# Patient Record
Sex: Female | Born: 1985 | Race: Black or African American | Hispanic: No | Smoking: Never smoker
Health system: Southern US, Community
[De-identification: ages and names within clinical notes are randomized; demographics above are authoritative.]

## PROBLEM LIST (undated history)

## (undated) ENCOUNTER — Inpatient Hospital Stay (HOSPITAL_COMMUNITY): Payer: Self-pay

## (undated) DIAGNOSIS — E049 Nontoxic goiter, unspecified: Secondary | ICD-10-CM

## (undated) DIAGNOSIS — IMO0002 Reserved for concepts with insufficient information to code with codable children: Secondary | ICD-10-CM

## (undated) DIAGNOSIS — O1490 Unspecified pre-eclampsia, unspecified trimester: Secondary | ICD-10-CM

## (undated) DIAGNOSIS — I10 Essential (primary) hypertension: Secondary | ICD-10-CM

## (undated) DIAGNOSIS — D649 Anemia, unspecified: Secondary | ICD-10-CM

## (undated) DIAGNOSIS — Z8632 Personal history of gestational diabetes: Secondary | ICD-10-CM

## (undated) DIAGNOSIS — B009 Herpesviral infection, unspecified: Secondary | ICD-10-CM

## (undated) DIAGNOSIS — O09299 Supervision of pregnancy with other poor reproductive or obstetric history, unspecified trimester: Secondary | ICD-10-CM

## (undated) DIAGNOSIS — L309 Dermatitis, unspecified: Secondary | ICD-10-CM

## (undated) DIAGNOSIS — A6000 Herpesviral infection of urogenital system, unspecified: Secondary | ICD-10-CM

## (undated) DIAGNOSIS — E669 Obesity, unspecified: Secondary | ICD-10-CM

## (undated) DIAGNOSIS — B999 Unspecified infectious disease: Secondary | ICD-10-CM

## (undated) HISTORY — PX: WISDOM TOOTH EXTRACTION: SHX21

## (undated) HISTORY — PX: TONSILLECTOMY: SUR1361

---
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
## 2005-03-20 ENCOUNTER — Ambulatory Visit (HOSPITAL_COMMUNITY): Admission: RE | Admit: 2005-03-20 | Discharge: 2005-03-20 | Payer: Self-pay | Admitting: Obstetrics & Gynecology

## 2006-02-04 ENCOUNTER — Emergency Department (HOSPITAL_COMMUNITY): Admission: EM | Admit: 2006-02-04 | Discharge: 2006-02-05 | Payer: Self-pay | Admitting: Emergency Medicine

## 2006-03-06 ENCOUNTER — Inpatient Hospital Stay (HOSPITAL_COMMUNITY): Admission: AD | Admit: 2006-03-06 | Discharge: 2006-03-06 | Payer: Self-pay | Admitting: Gynecology

## 2006-05-04 ENCOUNTER — Ambulatory Visit (HOSPITAL_COMMUNITY): Admission: RE | Admit: 2006-05-04 | Discharge: 2006-05-04 | Payer: Self-pay | Admitting: Obstetrics and Gynecology

## 2006-05-15 ENCOUNTER — Encounter: Payer: Self-pay | Admitting: Obstetrics and Gynecology

## 2006-05-15 ENCOUNTER — Inpatient Hospital Stay (HOSPITAL_COMMUNITY): Admission: AD | Admit: 2006-05-15 | Discharge: 2006-05-18 | Payer: Self-pay | Admitting: Obstetrics and Gynecology

## 2006-05-17 ENCOUNTER — Encounter (INDEPENDENT_AMBULATORY_CARE_PROVIDER_SITE_OTHER): Payer: Self-pay | Admitting: Specialist

## 2007-04-09 ENCOUNTER — Encounter: Admission: RE | Admit: 2007-04-09 | Discharge: 2007-04-09 | Payer: Self-pay | Admitting: Obstetrics and Gynecology

## 2007-04-18 ENCOUNTER — Inpatient Hospital Stay (HOSPITAL_COMMUNITY): Admission: AD | Admit: 2007-04-18 | Discharge: 2007-04-19 | Payer: Self-pay | Admitting: Obstetrics and Gynecology

## 2007-05-02 ENCOUNTER — Inpatient Hospital Stay (HOSPITAL_COMMUNITY): Admission: AD | Admit: 2007-05-02 | Discharge: 2007-05-02 | Payer: Self-pay | Admitting: Obstetrics and Gynecology

## 2007-05-14 ENCOUNTER — Inpatient Hospital Stay (HOSPITAL_COMMUNITY): Admission: AD | Admit: 2007-05-14 | Discharge: 2007-05-15 | Payer: Self-pay | Admitting: Obstetrics and Gynecology

## 2007-05-24 ENCOUNTER — Ambulatory Visit (HOSPITAL_COMMUNITY): Admission: RE | Admit: 2007-05-24 | Discharge: 2007-05-24 | Payer: Self-pay | Admitting: Obstetrics and Gynecology

## 2007-05-31 ENCOUNTER — Inpatient Hospital Stay (HOSPITAL_COMMUNITY): Admission: RE | Admit: 2007-05-31 | Discharge: 2007-06-02 | Payer: Self-pay | Admitting: Obstetrics and Gynecology

## 2007-07-09 ENCOUNTER — Inpatient Hospital Stay (HOSPITAL_COMMUNITY): Admission: AD | Admit: 2007-07-09 | Discharge: 2007-07-09 | Payer: Self-pay | Admitting: Obstetrics & Gynecology

## 2009-05-21 ENCOUNTER — Inpatient Hospital Stay (HOSPITAL_COMMUNITY): Admission: AD | Admit: 2009-05-21 | Discharge: 2009-05-23 | Payer: Self-pay | Admitting: Obstetrics and Gynecology

## 2009-05-22 ENCOUNTER — Encounter (INDEPENDENT_AMBULATORY_CARE_PROVIDER_SITE_OTHER): Payer: Self-pay | Admitting: Obstetrics and Gynecology

## 2010-02-27 ENCOUNTER — Encounter: Payer: Self-pay | Admitting: Obstetrics and Gynecology

## 2010-04-26 LAB — CBC
HCT: 27.3 % — ABNORMAL LOW (ref 36.0–46.0)
HCT: 30.7 % — ABNORMAL LOW (ref 36.0–46.0)
Hemoglobin: 10.1 g/dL — ABNORMAL LOW (ref 12.0–15.0)
Hemoglobin: 8.9 g/dL — ABNORMAL LOW (ref 12.0–15.0)
MCHC: 32.7 g/dL (ref 30.0–36.0)
MCHC: 32.9 g/dL (ref 30.0–36.0)
MCV: 74.2 fL — ABNORMAL LOW (ref 78.0–100.0)
MCV: 74.6 fL — ABNORMAL LOW (ref 78.0–100.0)
Platelets: 260 10*3/uL (ref 150–400)
Platelets: 266 10*3/uL (ref 150–400)
RBC: 3.67 MIL/uL — ABNORMAL LOW (ref 3.87–5.11)
RBC: 4.13 MIL/uL (ref 3.87–5.11)
RDW: 15.5 % (ref 11.5–15.5)
RDW: 15.9 % — ABNORMAL HIGH (ref 11.5–15.5)
WBC: 5.3 10*3/uL (ref 4.0–10.5)
WBC: 7.1 10*3/uL (ref 4.0–10.5)

## 2010-04-26 LAB — RPR: RPR Ser Ql: NONREACTIVE

## 2010-06-24 NOTE — Discharge Summary (Signed)
Tina Lambert, Tina Lambert           ACCOUNT NO.:  [id number]   MEDICAL RECORD NO.:  [id number]          PATIENT TYPE:  INP   LOCATION:  9307                          FACILITY:  [facility]   PHYSICIAN:  Rudy Jew. Ashley Royalty, M.D.DATE OF BIRTH:  Feb 23, 1985   DATE OF ADMISSION:  05/15/2006  DATE OF DISCHARGE:  05/18/2006                               DISCHARGE SUMMARY   DISCHARGE DIAGNOSIS:  1. Intrauterine pregnancy at 20 weeks 5 days, delivered.  2. Anhydramnios consistent with preterm premature rupture of      membranes.  3. Chorioamnionitis.  4. Status post therapeutic abortion via Cytotec due to preterm      premature rupture of membranes and chorioamnionitis.   OPERATIONS AND PROCEDURES:  OB delivery of nonviable fetus.   CONSULTATIONS:  None.   DISCHARGE MEDICATIONS:  1. Tylenol.  2. Ampicillin 500 mg q.i.d. x5 days.   HISTORY AND PHYSICAL:  This is a 25 year old primigravida 20 weeks 4  days gestation on admission.  Prenatal care was complicated by cervical  funneling noted for the first time at [facility] on March 28.  The  patient first presented to the office February 20.  Ultrasound was  obtained approximately February 26 revealed a intrauterine singleton  gestation at 14 weeks 5 days.  There were no other remarkable findings  at that time.  The patient returned for ultrasound [facility] May 04, 2006.  The infant was noted to be average for gestational age.  The  cervix was noted be somewhat short (1.5 cm in length) and funneled at  internal os.  She returned on May 07, 2006 and was following  recommendations provided to her on March 28 to engage in modified  bedrest and plans were made to repeat the ultrasound in the near future.  The patient returned on the day of admission stating that she was  approximately 8:00 a.m. leaking blood tinged fluid.  She presented to  the office with the aforementioned complaint.  Sterile speculum  examination revealed no  definite ferning.  The patient was subsequently  referred to [facility] for maternal fetal medicine consultation.  She saw Ashley Royalty, M.D. and ultrasound was performed.  The  ultrasound revealed anhydramnios consistent with preterm premature  rupture of membranes.  Cervical length was 2.6 cm.  The patient was  admitted for IV antibiotics and initial expectant management.  For  remainder of history physical please see chart.   HOSPITAL COURSE:  The patient was admitted to [facility].  Admission laboratory studies were drawn.  After maternal  fetal medicine consultation, decision was made to proceed with expectant  management having counseled the patient quite thoroughly over the grave  prognosis regarding the pregnancy as well as the risks of expectant  management including sepsis, infection, hysterectomy and possible  maternal and/or fetal death.  She was initially given IV antibiotics.  Tentative plan was to engage in approximately 48 hours antibiotics and  to be allowed to be discharged home until she became viable at which  time the patient would be readmitted for the duration of the pregnancy.  Unfortunately on May 16, 2006 the patient developed a fever despite the  IV antibiotics.  She was diagnosed with chorioamnionitis.  Decision was  made to proceed with therapeutic abortion.  Cytotec was placed and the  patient went on to deliver on May 17, 2006.  The infant was 336 grams  female Apgars 1 at one minute and 1 at 5 minutes.  The infant was  pronounced deceased at 7:30 a.m.  In deference to the nonviable  gestational age, no resuscitative efforts were instituted.  After  delivery the patient was maintained on IV antibiotics.  She was felt to  be stable for discharge on May 18, 2006 and was discharged home  afebrile and in satisfactory condition.   DISPOSITION:  The patient is return to the office in approximately 4  weeks for further evaluation  and therapy.      [name] A. Ashley Royalty, M.D.  Electronically Signed     JAM/MEDQ  D:  06/20/2006  T:  06/20/2006  Job:  [id number]

## 2010-06-24 NOTE — H&P (Signed)
Tina Lambert, Tina Lambert           ACCOUNT NO.:  [id number]   MEDICAL RECORD NO.:  [id number]          PATIENT TYPE:  INP   LOCATION:  9106                          [facility]  [facility]   PHYSICIAN:  Rudy Jew. Ashley Royalty, M.D.DATE OF BIRTH:  [birth year]   DATE OF ADMISSION:  05/15/2006  DATE OF DISCHARGE:                              HISTORY & PHYSICAL   This is a 25 year old primigravida, [facility] September 27, 2006, approximately  20 weeks 4 days gestation.  Prenatal care has been complicated by  transfer of care from the [facility], cervical  funneling noted at [facility] for the first time on  May 04, 2006.  The patient first presented to the office, March 28, 2006, at or about 14-weeks gestation.  Ultrasound was obtained, April 03, 2006, and revealed an intrauterine singleton gestation at 14 weeks 5  days.  There were no other remarkable findings at that time.  The  patient returned for ultrasound at [facility] on May 04, 2006.  The infant was noted to be average for gestational age with normal  visualized anatomy.  The cervix was noted to be somewhat short (1.5 cm  length) and funneled at the internal os.  She returned on May 07, 2006, and was following the recommendations provided to her on March  28th to engage in modified bedrest, and plan was made to repeat the  ultrasound in the near future.  The patient returned today stating that  she woke up approximately 8 a.m. leaking blood-tinged fluid.  She denied  having a discrete gush.  However, she states the fluid could not really  be stopped by her.  She presented to the office with the aforementioned  complaint.  Sterile speculum examination was performed by me in the  office and revealed approximately 5 ml of blood-tinged fluid in the  vault.  Attempts were made to collect some fluid for ferning.  Examination under the microscope revealed no definite ferning.  A  sterile vaginal  examination was avoided.  The patient was subsequently  referred to [facility] for a Maternal-Fetal Medicine consultation.  She saw Dr. Eustaquio Boyden, and an ultrasound was performed.  The ultrasound  revealed anhydramnios consistent with preterm premature rupture of  membranes.  The gestational age was consistent with the aforementioned  menstrual dates.  The cervical length was 2.6 cm.  The amniotic fluid  was virtually nonexistent, obviously less than a percentile.  The  patient is admitted for IV antibiotics and initial expectant management.   MEDICATIONS:  Vitamins as per prenatal history.   PAST MEDICAL HISTORY:  Negative.   PAST SURGICAL HISTORY:  Negative.   ALLERGIES:  None.   FAMILY HISTORY:  Noncontributory.   SOCIAL HISTORY:  The patient denies the use of tobacco or significant  alcohol.   REVIEW OF SYSTEMS:  Noncontributory.   PHYSICAL EXAMINATION:  GENERAL:  A well-developed, well-nourished,  pleasant, black female in no acute distress.  VITAL SIGNS:  Afebrile, vital signs stable.  SKIN:  Warm and dry without lesions.  LYMPH:  There is no supraclavicular, cervical, or inguinal adenopathy.  HEENT:  Normocephalic.  NECK:  Supple without thyromegaly.  CHEST:  Lungs are clear.  CARDIAC:  Regular rate and rhythm.  ABDOMEN:  Soft and nontender.  The fundal height is approximately at the  umbilicus.  Fetal heart tones are auscultated.  MUSCULOSKELETAL EXAMINATION:  No CVA tenderness.  PELVIC EXAMINATION:  Deferred.  It should be mentioned that earlier in  the day with the sterile speculum examination the cervix was only  minimally opened visually.  A digital examination was performed.   IMPRESSION:  1. Intrauterine pregnancy at 20 weeks 4 days gestation.  2. Anhydramnios - consistent with preterm premature rupture of      membranes.   PLAN:  Had a long conversation with the patient as well as with Dr.  Eustaquio Boyden.  Dr. Eustaquio Boyden also had a long conversation with the  patient as  well.  The patient was apprised of the preterm premature rupture of  membranes with associated poor prognosis.  She was offered expectant  management versus termination.  She was apprised of the risk of  expectant management including infection, sepsis, hysterectomy, and  possible maternal and fetal death.  We also discussed fetal  complications including, but not limited to pulmonary hyperplasia, limb  contractions, and infection in extreme prematurity along with the  associated morbidity and potential mortality.  She was further informed  that if she develops chorioamnionitis, she needs to be delivered  regardless of gestational age.  She was informed that if she labors, no  attempt to stop labor will be rendered.  After the conversation, the  patient decided after careful consideration to engage in expectant  management.  Hence, the plan is to bring her into the hospital for 48  hours of IV antibiotics including Ampicillin and Erythromycin.  Will  observe closely for signs/symptoms of chorioamnionitis and deliver  accordingly if such signs/symptoms ensue.  Will obtain Neonatology  consultation during her admission.  After 48 hours of antibiotics, the  tentative plan is to discharge home on five days of Ampicillin 500 mg  q.i.d. and Erythromycin 500 mg q.i.d. if no clinical signs or symptoms  of chorioamnionitis ensue.  After discharge, the patient is to monitor  her temperature daily and return immediately should she develop a fever  or abdominal pain.  Should she remain stable at home until 23 to [redacted]  weeks gestation, the tentative plan would be to admit her for the  duration of pregnancy and give betamethasone.  Should the patient remain  undelivered, we will follow up with an ultrasound in approximately two  weeks.  The patient states she understands and accepts all of the above.  Questions were invited and answered.      [name] A. Ashley Royalty, M.D.  Electronically  Signed     JAM/MEDQ  D:  05/15/2006  T:  05/15/2006  Job:  [id number]   cc:   NICU or Newborn Nursery on 1st floor

## 2010-10-31 LAB — URINALYSIS, ROUTINE W REFLEX MICROSCOPIC
Bilirubin Urine: NEGATIVE
Glucose, UA: 500 — AB
Hgb urine dipstick: NEGATIVE
Ketones, ur: NEGATIVE
Nitrite: NEGATIVE
Protein, ur: NEGATIVE
Specific Gravity, Urine: 1.015
Urobilinogen, UA: 0.2
pH: 6.5

## 2010-10-31 LAB — URINE MICROSCOPIC-ADD ON

## 2010-10-31 LAB — WET PREP, GENITAL
Clue Cells Wet Prep HPF POC: NONE SEEN
Trich, Wet Prep: NONE SEEN
Yeast Wet Prep HPF POC: NONE SEEN

## 2010-10-31 LAB — FETAL FIBRONECTIN: Fetal Fibronectin: NEGATIVE

## 2010-11-01 LAB — CBC
HCT: 31.6 — ABNORMAL LOW
Hemoglobin: 10.9 — ABNORMAL LOW
MCHC: 34.6
MCV: 76.6 — ABNORMAL LOW
Platelets: 229
Platelets: 281
RBC: 4.12
RDW: 15.1
RDW: 15.4
WBC: 7.2

## 2010-11-01 LAB — RPR: RPR Ser Ql: NONREACTIVE

## 2010-11-03 LAB — CBC
Hemoglobin: 11.1 — ABNORMAL LOW
RBC: 4.53
WBC: 4.9

## 2011-09-27 ENCOUNTER — Inpatient Hospital Stay (HOSPITAL_COMMUNITY)
Admission: AD | Admit: 2011-09-27 | Discharge: 2011-09-27 | Disposition: A | Discharge status: Home / Self Care | Payer: Medicaid Other | Source: Ambulatory Visit | Attending: Obstetrics & Gynecology | Admitting: Obstetrics & Gynecology

## 2011-09-27 ENCOUNTER — Encounter (HOSPITAL_COMMUNITY): Payer: Self-pay

## 2011-09-27 DIAGNOSIS — E860 Dehydration: Secondary | ICD-10-CM | POA: Insufficient documentation

## 2011-09-27 DIAGNOSIS — R112 Nausea with vomiting, unspecified: Secondary | ICD-10-CM

## 2011-09-27 DIAGNOSIS — R51 Headache: Secondary | ICD-10-CM | POA: Insufficient documentation

## 2011-09-27 HISTORY — DX: Anemia, unspecified: D64.9

## 2011-09-27 LAB — POCT PREGNANCY, URINE: Preg Test, Ur: NEGATIVE

## 2011-09-27 LAB — WET PREP, GENITAL
Trich, Wet Prep: NONE SEEN
Yeast Wet Prep HPF POC: NONE SEEN

## 2011-09-27 LAB — URINALYSIS, ROUTINE W REFLEX MICROSCOPIC
Glucose, UA: NEGATIVE mg/dL
Specific Gravity, Urine: >1.03 — ABNORMAL HIGH (ref 1.005–1.030)

## 2011-09-27 LAB — URINE MICROSCOPIC-ADD ON

## 2011-09-27 MED ORDER — ONDANSETRON 8 MG PO TBDP
8.0000 mg | ORAL_TABLET | Freq: Once | ORAL | Status: AC
  Administered 2011-09-27: 8 mg via ORAL
  Filled 2011-09-27: qty 1

## 2011-09-27 MED ORDER — KETOROLAC TROMETHAMINE 60 MG/2ML IM SOLN
60.0000 mg | Freq: Once | INTRAMUSCULAR | Status: AC
  Administered 2011-09-27: 60 mg via INTRAMUSCULAR
  Filled 2011-09-27: qty 2

## 2011-09-27 MED ORDER — PROMETHAZINE HCL 25 MG PO TABS: 25.0000 mg | ORAL_TABLET | Freq: Four times a day (QID) | ORAL | Status: DC | PRN

## 2011-09-27 NOTE — MAU Provider Note (Signed)
History     CSN: [id number]  Arrival date and time: 09/27/11 1731   First Provider Initiated Contact with Patient 09/27/11 1826      Chief Complaint  Patient presents with  . Possible Pregnancy  . Emesis  . Headache  . Dizziness   HPI Tina Lambert 26 y.o.  Comes to MAU today with vomiting and headache with dizziness off and on for 2 weeks.  Is not having dizziness now but was earlier today.  States her blood pressure was elevated earlier today when at work.  Is having some vaginal bleeding for the past 4 days but does not think this is her period.  Has Hx of irregular cycles and LMP was 08-31-11.  Today had oatmeal for breakfast but vomited after eating later today.  Did not call the office as she was working today.  Took tylenol for her headache but was not able to keep it down.  Gave plasma yesterday.  Is not using any contraception at present.  Would like to be pregnant and was hoping she was pregnant today.  MAU pregnancy test is negative.  OB History    Grav Para Term Preterm Abortions TAB SAB Ect Mult Living   3 2 2  1  1   2       Past Medical History  Diagnosis Date  . Anemia     Past Surgical History  Procedure Date  . Tonsillectomy     Family History  Problem Relation Age of Onset  . Other Neg Hx     History  Substance Use Topics  . Smoking status: Never Smoker   . Smokeless tobacco: Never Used  . Alcohol Use: No    Allergies: No Known Allergies  Prescriptions prior to admission  Medication Sig Dispense Refill  . acetaminophen (TYLENOL) 500 MG tablet Take 500 mg by mouth every 6 (six) hours as needed. For pain or headache      . Multiple Vitamin (MULTIVITAMIN WITH MINERALS) TABS Take 1 tablet by mouth daily.        Review of Systems  Constitutional: Negative for fever.  Gastrointestinal: Positive for nausea, vomiting and abdominal pain. Negative for diarrhea and constipation.  Genitourinary:       No vaginal discharge. Vaginal bleeding. No  dysuria.  Neurological: Positive for dizziness and headaches.   Physical Exam   Blood pressure 117/79, pulse 84, temperature 98.3 F (36.8 C), temperature source Oral, resp. rate 16, height 5\' 1"  (1.549 m), weight 162 lb 12.8 oz (73.846 kg), last menstrual period 09/24/2011, SpO2 100.00%.  Physical Exam  Nursing note and vitals reviewed. Constitutional: She is oriented to person, place, and time. She appears well-developed and well-nourished.  HENT:  Head: Normocephalic.  Eyes: EOM are normal.  Neck: Neck supple.  GI: Soft.       Mild lower abdominal tenderness with exam  Genitourinary:       Speculum exam: Vulva - small amount of blood noted Vagina - Small amount of blood Cervix - No contact bleeding Bimanual exam: Cervix closed Uterus non tender, normal size Adnexa non tender, no masses bilaterally GC/Chlam, wet prep done Chaperone present for exam.  Musculoskeletal: Normal range of motion.  Neurological: She is alert and oriented to person, place, and time.  Skin: Skin is warm and dry.  Psychiatric: She has a normal mood and affect.    MAU Course  Procedures  MDM Results for orders placed during the hospital encounter of 09/27/11 (from the past 24 hour(s))  URINALYSIS, ROUTINE W REFLEX MICROSCOPIC     Status: Abnormal   Collection Time   09/27/11  5:50 PM      Component Value Range   Color, Urine YELLOW  YELLOW   APPearance CLOUDY (*) CLEAR   Specific Gravity, Urine >1.030 (*) 1.005 - 1.030   pH 6.0  5.0 - 8.0   Glucose, UA NEGATIVE  NEGATIVE mg/dL   Hgb urine dipstick LARGE (*) NEGATIVE   Bilirubin Urine NEGATIVE  NEGATIVE   Ketones, ur NEGATIVE  NEGATIVE mg/dL   Protein, ur NEGATIVE  NEGATIVE mg/dL   Urobilinogen, UA 0.2  0.0 - 1.0 mg/dL   Nitrite NEGATIVE  NEGATIVE   Leukocytes, UA NEGATIVE  NEGATIVE  URINE MICROSCOPIC-ADD ON     Status: Abnormal   Collection Time   09/27/11  5:50 PM      Component Value Range   Squamous Epithelial / LPF MANY (*) RARE    WBC, UA 7-10  <3 WBC/hpf   RBC / HPF TOO NUMEROUS TO COUNT  <3 RBC/hpf   Bacteria, UA MANY (*) RARE   Urine-Other MUCOUS PRESENT    POCT PREGNANCY, URINE     Status: Normal   Collection Time   09/27/11  5:57 PM      Component Value Range   Preg Test, Ur NEGATIVE  NEGATIVE  WET PREP, GENITAL     Status: Abnormal   Collection Time   09/27/11  7:03 PM      Component Value Range   Yeast Wet Prep HPF POC NONE SEEN  NONE SEEN   Trich, Wet Prep NONE SEEN  NONE SEEN   Clue Cells Wet Prep HPF POC NONE SEEN  NONE SEEN   WBC, Wet Prep HPF POC FEW (*) NONE SEEN   Given Zofran 8 mg ODT for nausea and Toradol 60 mg IM for pain  Assessment and Plan  On menses Negative pregnancy test Nausea and vomiting Headache Mild dehydration  Plan Drink at least 8 8-oz glasses of water every day. rx phenergan 25 mg 1/2 to one tablet by mouth every 4-6 hours as needed for nausea (#20) no refills.  Sedation precautions. BRAT diet if having nausea and vomiting. Make an appointment with your doctor if your symptoms do not improve.  Tina Lambert 09/27/2011, 6:51 PM

## 2011-09-27 NOTE — MAU Note (Signed)
Pt states had abnormal menstrual cycle this month. Was bright red and usually is not, no clots noted. Never has cramps, however is having "stomach" pain. Denies abnormal vaginal discharge.

## 2011-09-27 NOTE — MAU Note (Signed)
Patient states she has had nausea and vomiting for about 2 weeks, not able to keep anything down. Now feeling dizzy and having headaches. Having light spotting.

## 2011-09-28 LAB — GC/CHLAMYDIA PROBE AMP, GENITAL: GC Probe Amp, Genital: NEGATIVE

## 2011-10-25 ENCOUNTER — Encounter (HOSPITAL_COMMUNITY): Payer: Self-pay | Admitting: *Deleted

## 2011-10-25 ENCOUNTER — Inpatient Hospital Stay (HOSPITAL_COMMUNITY)
Admission: AD | Admit: 2011-10-25 | Discharge: 2011-10-25 | Disposition: A | Discharge status: Home / Self Care | Payer: Self-pay | Source: Ambulatory Visit | Attending: Obstetrics and Gynecology | Admitting: Obstetrics and Gynecology

## 2011-10-25 DIAGNOSIS — M7918 Myalgia, other site: Secondary | ICD-10-CM

## 2011-10-25 DIAGNOSIS — IMO0001 Reserved for inherently not codable concepts without codable children: Secondary | ICD-10-CM

## 2011-10-25 DIAGNOSIS — R109 Unspecified abdominal pain: Secondary | ICD-10-CM | POA: Insufficient documentation

## 2011-10-25 DIAGNOSIS — Z3201 Encounter for pregnancy test, result positive: Secondary | ICD-10-CM | POA: Insufficient documentation

## 2011-10-25 DIAGNOSIS — IMO0002 Reserved for concepts with insufficient information to code with codable children: Secondary | ICD-10-CM

## 2011-10-25 LAB — URINALYSIS, ROUTINE W REFLEX MICROSCOPIC
Ketones, ur: NEGATIVE mg/dL
Nitrite: NEGATIVE
Urobilinogen, UA: 0.2 mg/dL (ref 0.0–1.0)
pH: 6 (ref 5.0–8.0)

## 2011-10-25 LAB — URINE MICROSCOPIC-ADD ON

## 2011-10-25 NOTE — MAU Provider Note (Signed)
Chief Complaint: Possible Pregnancy   First Provider Initiated Contact with Patient 10/25/11 2213     SUBJECTIVE HPI: Tina Lambert is a 26 y.o. [id number] at [redacted]w[redacted]d by LMP who presents to MAU reporting pos home UPT. Wants to be sure she is pregnant and get a pregnancy verification letter. Plans to start care at [facility]. Also reports 2 week Hx of neck and mid-back pain. No flank pain, fever, urinary Sx, vaginal bleeding, abd pain, injury or pain radiating down legs. Admits to carrying very large pocketbook on left shoulder.   Past Medical History  Diagnosis Date  . Anemia    OB History    Grav Para Term Preterm Abortions TAB SAB Ect Mult Living   4 2 2  1  1   2      # Outc Date GA Lbr Len/2nd Wgt Sex Del Anes PTL Lv   1 CUR            2 SAB            3 TRM            4 TRM              Past Surgical History  Procedure Date  . Tonsillectomy    History   Social History  . Marital Status: Single    Spouse Name: N/A    Number of Children: N/A  . Years of Education: N/A   Occupational History  . Not on file.   Social History Main Topics  . Smoking status: Never Smoker   . Smokeless tobacco: Never Used  . Alcohol Use: No  . Drug Use: No  . Sexually Active: Yes    Birth Control/ Protection: None   Other Topics Concern  . Not on file   Social History Narrative  . No narrative on file   No current facility-administered medications on file prior to encounter.   Current Outpatient Prescriptions on File Prior to Encounter  Medication Sig Dispense Refill  . Multiple Vitamin (MULTIVITAMIN WITH MINERALS) TABS Take 1 tablet by mouth daily.      . promethazine (PHENERGAN) 25 MG tablet Take 1 tablet (25 mg total) by mouth every 6 (six) hours as needed for nausea. May take 1/2 tablet for mild symptoms.  Sedation precautions.  20 tablet  0   No Known Allergies  ROS: Pertinent items in HPI  OBJECTIVE Blood pressure 134/78, pulse 116, temperature 98.4 F (36.9  C), temperature source Oral, resp. rate 20, height 4\' 11"  (1.499 m), weight 73.483 kg (162 lb), last menstrual period 09/24/2011, SpO2 100.00%. Patient Vitals for the past 24 hrs:  BP Temp Temp src Pulse Resp SpO2 Height Weight  10/25/11 2245 123/76 mmHg 99.2 F (37.3 C) Oral 85  18  - - -  10/25/11 2134 134/78 mmHg 98.4 F (36.9 C) Oral 116  20  100 % 4\' 11"  (1.499 m) 73.483 kg (162 lb)   GENERAL: Well-developed, well-nourished female in no acute distress.  HEENT: Normocephalic HEART: normal rate RESP: normal effort ABDOMEN: Soft, non-tender BACK: Mild tenderness over ribs mid and upper back and neck. No CVAT.  EXTREMITIES: Nontender, no edema NEURO: Alert and oriented SPECULUM EXAM: deferred  LAB RESULTS Results for orders placed during the hospital encounter of 10/25/11 (from the past 24 hour(s))  POCT PREGNANCY, URINE     Status: Abnormal   Collection Time   10/25/11  9:43 PM      Component Value Range  Preg Test, Ur POSITIVE (*) NEGATIVE    IMAGING No results found.  ED COURSE  ASSESSMENT 1. Musculoskeletal pain   2. Pregnancy, status unknown    PLAN Discharge home Discussed comfort measures, smaller, lighter bag, alternating shoulders.  Pregnancy verification letter given. Follow-up Information    Schedule an appointment as soon as possible for a visit with Levi Aland, MD.   Contact information:   [address] [place] [address] [phone]           Medication List     As of 10/26/2011 12:13 AM    TAKE these medications         multivitamin with minerals Tabs   Take 1 tablet by mouth daily.      promethazine 25 MG tablet   Commonly known as: PHENERGAN   Take 1 tablet (25 mg total) by mouth every 6 (six) hours as needed for nausea. May take 1/2 tablet for mild symptoms.  Sedation precautions.         [name], [place] 10/25/2011  10:28 PM

## 2011-10-25 NOTE — MAU Note (Signed)
Pt presents with neck and back pain.  Pt with + pregnancy test at home.  Test here also positive. Pt  With other pregnancy symptoms such as urinary urgency, breast pain, and shortness of breath and congestion.

## 2011-10-25 NOTE — MAU Note (Signed)
Pt states she thnks she is preg and her bck is hurting from the neck all the way down

## 2011-12-01 ENCOUNTER — Other Ambulatory Visit: Payer: Self-pay

## 2011-12-19 LAB — OB RESULTS CONSOLE ANTIBODY SCREEN: Antibody Screen: NEGATIVE

## 2011-12-19 LAB — OB RESULTS CONSOLE HEPATITIS B SURFACE ANTIGEN: Hepatitis B Surface Ag: NEGATIVE

## 2011-12-19 LAB — OB RESULTS CONSOLE RUBELLA ANTIBODY, IGM: Rubella: IMMUNE

## 2011-12-28 ENCOUNTER — Encounter (HOSPITAL_COMMUNITY): Payer: Self-pay | Admitting: *Deleted

## 2011-12-28 ENCOUNTER — Inpatient Hospital Stay (HOSPITAL_COMMUNITY)
Admission: AD | Admit: 2011-12-28 | Discharge: 2011-12-28 | Disposition: A | Discharge status: Home / Self Care | Payer: Medicaid Other | Source: Ambulatory Visit | Attending: Obstetrics and Gynecology | Admitting: Obstetrics and Gynecology

## 2011-12-28 DIAGNOSIS — D649 Anemia, unspecified: Secondary | ICD-10-CM | POA: Insufficient documentation

## 2011-12-28 DIAGNOSIS — A5901 Trichomonal vulvovaginitis: Secondary | ICD-10-CM | POA: Insufficient documentation

## 2011-12-28 DIAGNOSIS — N930 Postcoital and contact bleeding: Secondary | ICD-10-CM

## 2011-12-28 LAB — ABO/RH: ABO/RH(D): O POS

## 2011-12-28 LAB — WET PREP, GENITAL
Clue Cells Wet Prep HPF POC: NONE SEEN
Yeast Wet Prep HPF POC: NONE SEEN

## 2011-12-28 LAB — CBC
MCHC: 32.6 g/dL (ref 30.0–36.0)
Platelets: 245 10*3/uL (ref 150–400)
RDW: 17.8 % — ABNORMAL HIGH (ref 11.5–15.5)
WBC: 5.1 10*3/uL (ref 4.0–10.5)

## 2011-12-28 LAB — URINALYSIS, ROUTINE W REFLEX MICROSCOPIC
Glucose, UA: NEGATIVE mg/dL
Hgb urine dipstick: NEGATIVE
Protein, ur: NEGATIVE mg/dL
Specific Gravity, Urine: >1.03 — ABNORMAL HIGH (ref 1.005–1.030)
pH: 6 (ref 5.0–8.0)

## 2011-12-28 LAB — URINE MICROSCOPIC-ADD ON

## 2011-12-28 MED ORDER — METRONIDAZOLE 500 MG PO TABS: 500.0000 mg | ORAL_TABLET | Freq: Two times a day (BID) | ORAL | Status: DC

## 2011-12-28 NOTE — MAU Provider Note (Signed)
History     CSN: [id number]  Arrival date and time: 12/28/11 4098   First Provider Initiated Contact with Patient 12/28/11 1059      Chief Complaint  Patient presents with  . Vaginal Bleeding   HPI Tina Lambert is a 26 y.o. female @ [redacted]w[redacted]d gestation who presents to MAU with vaginal bleeding. The bleeding started last night after sexual intercourse. She describes the bleeding as scant amount when wiping. She denies pain. The history was provided by the patient.  OB History    Grav Para Term Preterm Abortions TAB SAB Ect Mult Living   4 2 2  1  1   2       Past Medical History  Diagnosis Date  . Anemia     Past Surgical History  Procedure Date  . Tonsillectomy     Family History  Problem Relation Age of Onset  . Other Neg Hx     History  Substance Use Topics  . Smoking status: Never Smoker   . Smokeless tobacco: Never Used  . Alcohol Use: No    Allergies: No Known Allergies  Prescriptions prior to admission  Medication Sig Dispense Refill  . Multiple Vitamin (MULTIVITAMIN WITH MINERALS) TABS Take 1 tablet by mouth daily.      . promethazine (PHENERGAN) 25 MG tablet Take 1 tablet (25 mg total) by mouth every 6 (six) hours as needed for nausea. May take 1/2 tablet for mild symptoms.  Sedation precautions.  20 tablet  0    Review of Systems  Constitutional: Negative for fever, chills and weight loss.  HENT: Negative for ear pain, nosebleeds, congestion, sore throat and neck pain.   Eyes: Negative for double vision, photophobia and pain.  Respiratory: Negative for cough, shortness of breath and wheezing.   Cardiovascular: Negative for chest pain, palpitations and leg swelling.  Gastrointestinal: Positive for heartburn and nausea. Negative for vomiting, abdominal pain, diarrhea and constipation.  Genitourinary: Positive for frequency. Negative for dysuria and urgency.  Musculoskeletal: Negative for myalgias and back pain.  Skin: Negative for itching and rash.   Neurological: Negative for dizziness, sensory change, speech change, seizures, weakness and headaches.  Endo/Heme/Allergies: Does not bruise/bleed easily.  Psychiatric/Behavioral: Negative for depression. The patient is not nervous/anxious and does not have insomnia.    Physical Exam   Blood pressure 118/69, pulse 76, temperature 97.7 F (36.5 C), temperature source Oral, resp. rate 18, last menstrual period 09/24/2011.  Physical Exam  Nursing note and vitals reviewed. Constitutional: She is oriented to person, place, and time. She appears well-developed and well-nourished. No distress.  HENT:  Head: Normocephalic and atraumatic.  Eyes: EOM are normal.  Neck: Neck supple.  Cardiovascular: Normal rate.   Respiratory: Effort normal.  GI: Soft. There is no tenderness.       Positive FHT's.  Genitourinary:       External genitalia without lesions. Frothy blood tinged yellow discharge vaginal vault. No bleeding noted. Cervix closed, thick, no CMT, no adnexal tenderness. Uterus consistent with dates.  Musculoskeletal: Normal range of motion.  Neurological: She is alert and oriented to person, place, and time.  Skin: Skin is warm and dry.  Psychiatric: She has a normal mood and affect. Her behavior is normal. Judgment and thought content normal.   Results for orders placed during the hospital encounter of 12/28/11 (from the past 24 hour(s))  URINALYSIS, ROUTINE W REFLEX MICROSCOPIC     Status: Abnormal   Collection Time   12/28/11 10:37 AM  Component Value Range   Color, Urine YELLOW  YELLOW   APPearance CLEAR  CLEAR   Specific Gravity, Urine >1.030 (*) 1.005 - 1.030   pH 6.0  5.0 - 8.0   Glucose, UA NEGATIVE  NEGATIVE mg/dL   Hgb urine dipstick NEGATIVE  NEGATIVE   Bilirubin Urine NEGATIVE  NEGATIVE   Ketones, ur NEGATIVE  NEGATIVE mg/dL   Protein, ur NEGATIVE  NEGATIVE mg/dL   Urobilinogen, UA 0.2  0.0 - 1.0 mg/dL   Nitrite NEGATIVE  NEGATIVE   Leukocytes, UA SMALL (*)  NEGATIVE  URINE MICROSCOPIC-ADD ON     Status: Abnormal   Collection Time   12/28/11 10:37 AM      Component Value Range   Squamous Epithelial / LPF MANY (*) RARE   WBC, UA 3-6  <3 WBC/hpf   RBC / HPF 0-2  <3 RBC/hpf   Urine-Other MUCOUS PRESENT    WET PREP, GENITAL     Status: Abnormal   Collection Time   12/28/11 11:05 AM      Component Value Range   Yeast Wet Prep HPF POC NONE SEEN  NONE SEEN   Trich, Wet Prep FEW (*) NONE SEEN   Clue Cells Wet Prep HPF POC NONE SEEN  NONE SEEN   WBC, Wet Prep HPF POC TOO NUMEROUS TO COUNT (*) NONE SEEN  ABO/RH     Status: Normal   Collection Time   12/28/11 11:10 AM      Component Value Range   ABO/RH(D) O POS    CBC     Status: Abnormal   Collection Time   12/28/11 11:13 AM      Component Value Range   WBC 5.1  4.0 - 10.5 K/uL   RBC 4.14  3.87 - 5.11 MIL/uL   Hemoglobin 9.5 (*) 12.0 - 15.0 g/dL   HCT 40.9 (*) 81.1 - 91.4 %   MCV 70.3 (*) 78.0 - 100.0 fL   MCH 22.9 (*) 26.0 - 34.0 pg   MCHC 32.6  30.0 - 36.0 g/dL   RDW 78.2 (*) 95.6 - 21.3 %   Platelets 245  150 - 400 K/uL   Assessment: 26 y.o. female @[redacted]w[redacted]d  gestation with postcoital bleeding.   Trichomonas vaginosis   Anemia  Plan:  Rx Flagyl   Discussed need for partner treatment of Trich   Discussed anemia and need for vitamins with Fe   Follow up with Dr. Arlyce Dice, return here as needed    MAU Course: discussed with Dr. Fredia Sorrow  Procedures   NEESE,HOPE, RN, FNP, [facility] 12/28/2011, 10:59 AM

## 2011-12-28 NOTE — MAU Note (Signed)
Pt reports she noticed some vaginal bleeding last night no cramping. Bleeding less today.

## 2011-12-30 LAB — GC/CHLAMYDIA PROBE AMP, GENITAL: Chlamydia, DNA Probe: NEGATIVE

## 2012-02-07 NOTE — L&D Delivery Note (Signed)
Delivery Note At 9:52 AM an SGA, viable and healthy female was delivered via Vaginal, Spontaneous Delivery (OP manually rotated to OA).  APGAR: 7, 8; weight 5 lb 1.1 oz (2300 g).   Placenta status: Intact, Spontaneous Pathology.  Cord: 3 vessels with loose nuchal loop.  Anesthesia: Epidural  Episiotomy: None Lacerations: None Est. Blood Loss (mL): 300  Mom to postpartum.  Baby to nursery-stable.  Inri Sobieski D 06/24/2012, 12:46 PM

## 2012-04-13 ENCOUNTER — Encounter (HOSPITAL_COMMUNITY): Payer: Self-pay | Admitting: Family

## 2012-04-13 ENCOUNTER — Inpatient Hospital Stay (HOSPITAL_COMMUNITY)
Admission: AD | Admit: 2012-04-13 | Discharge: 2012-04-13 | Disposition: A | Discharge status: Home / Self Care | Payer: Medicaid Other | Source: Ambulatory Visit | Attending: Obstetrics and Gynecology | Admitting: Obstetrics and Gynecology

## 2012-04-13 DIAGNOSIS — O99891 Other specified diseases and conditions complicating pregnancy: Secondary | ICD-10-CM | POA: Insufficient documentation

## 2012-04-13 DIAGNOSIS — M549 Dorsalgia, unspecified: Secondary | ICD-10-CM | POA: Insufficient documentation

## 2012-04-13 LAB — URINE MICROSCOPIC-ADD ON

## 2012-04-13 LAB — URINALYSIS, ROUTINE W REFLEX MICROSCOPIC
Nitrite: NEGATIVE
Specific Gravity, Urine: 1.02 (ref 1.005–1.030)
pH: 6 (ref 5.0–8.0)

## 2012-04-13 MED ORDER — CYCLOBENZAPRINE HCL 5 MG PO TABS: 5.0000 mg | ORAL_TABLET | Freq: Three times a day (TID) | ORAL | Status: DC | PRN

## 2012-04-13 MED ORDER — OXYCODONE-ACETAMINOPHEN 5-325 MG PO TABS
2.0000 | ORAL_TABLET | Freq: Once | ORAL | Status: AC
  Administered 2012-04-13: 2 via ORAL
  Filled 2012-04-13: qty 2

## 2012-04-13 NOTE — MAU Provider Note (Signed)
  History     CSN: [id number]  Arrival date and time: 04/13/12 1114   None     No chief complaint on file.  HPI Ms Fader is a 26yo [id number] at 28.6wks who presents for upper back pain radiating down which began yesterday. Denies contractions, leaking or bldg. No dysuria. States she has been sleeping on an air mattress x 2 weeks and has been lifting her toddler. Other than those situations, she cannot remember an episode of injury to her back.  OB History   Grav Para Term Preterm Abortions TAB SAB Ect Mult Living   4 3 0 3 0  0   2      Past Medical History  Diagnosis Date  . Anemia     Past Surgical History  Procedure Laterality Date  . Tonsillectomy    . Wisdom tooth extraction      Family History  Problem Relation Age of Onset  . Other Neg Hx     History  Substance Use Topics  . Smoking status: Never Smoker   . Smokeless tobacco: Never Used  . Alcohol Use: No    Allergies: No Known Allergies  Prescriptions prior to admission  Medication Sig Dispense Refill  . PRESCRIPTION MEDICATION Take 1 capsule by mouth 2 (two) times daily. Iron supplement sample given from dr office        ROS Physical Exam   Blood pressure 115/69, pulse 105, temperature 97.6 F (36.4 C), temperature source Oral, resp. rate 18, height 4\' 11"  (1.499 m), weight 183 lb (83.008 kg), last menstrual period 09/24/2011.  Physical Exam  Nursing note and vitals reviewed. Constitutional: She is oriented to person, place, and time. She appears well-developed.  HENT:  Head: Normocephalic.  Cardiovascular: Normal rate.   Respiratory: Effort normal.  Genitourinary: Vagina normal.  Cx closed/50/-2/post  Musculoskeletal: Normal range of motion.  Neurological: She is alert and oriented to person, place, and time.  Skin: Skin is warm and dry.  Psychiatric: She has a normal mood and affect. Her behavior is normal. Thought content normal.   FHR 140s + accels, no decels No ctx per  toco  Urinalysis    Component Value Date/Time   COLORURINE YELLOW 04/13/2012 1126   APPEARANCEUR HAZY* 04/13/2012 1126   LABSPEC 1.020 04/13/2012 1126   PHURINE 6.0 04/13/2012 1126   GLUCOSEU NEGATIVE 04/13/2012 1126   HGBUR NEGATIVE 04/13/2012 1126   BILIRUBINUR NEGATIVE 04/13/2012 1126   KETONESUR NEGATIVE 04/13/2012 1126   PROTEINUR NEGATIVE 04/13/2012 1126   UROBILINOGEN 1.0 04/13/2012 1126   NITRITE NEGATIVE 04/13/2012 1126   LEUKOCYTESUR MODERATE* 04/13/2012 1126     MAU Course  Procedures  Given Percocet x 2 Assessment and Plan  IUP at 28.6wks Musculoskeletal back pain  Feels better after medication; requesting to go home D/C by Deidre Poe CNM (I was off unit) Urine to culture per reflex Given Rx Flexeril 5mg  F/U at next scheduled visit  Cam Hai 04/13/2012, 12:39 PM   I discharged pt when she was asking to leave and D/W Dr. Claiborne Billings after pt left. Danae Orleans, CNM 04/15/2012 10:18 AM

## 2012-04-13 NOTE — MAU Note (Addendum)
Patient presents to MAU with c/o of back pain radiating from shoulders to lower back since 1200 yesterday. Denies heavy lifting or trauma, although she states she does pick up her 27-year old daughter.  Patient states she recently moved and has been sleeping on air mattress x 2 weeks.  Denies dysuria, abdominal cramping, or vaginal bleeding. Reports she is nauseous, but denies emesis, diarrhea, chills or fever. Reports fetal movement today. Patient's next appointment is Tuesday, March 11.

## 2012-04-14 LAB — URINE CULTURE

## 2012-04-18 ENCOUNTER — Encounter (HOSPITAL_COMMUNITY): Payer: Self-pay | Admitting: *Deleted

## 2012-04-18 ENCOUNTER — Inpatient Hospital Stay (HOSPITAL_COMMUNITY)
Admission: AD | Admit: 2012-04-18 | Discharge: 2012-04-18 | Disposition: A | Discharge status: Home / Self Care | Payer: Medicaid Other | Source: Ambulatory Visit | Attending: Obstetrics and Gynecology | Admitting: Obstetrics and Gynecology

## 2012-04-18 DIAGNOSIS — O2390 Unspecified genitourinary tract infection in pregnancy, unspecified trimester: Secondary | ICD-10-CM | POA: Insufficient documentation

## 2012-04-18 DIAGNOSIS — O368190 Decreased fetal movements, unspecified trimester, not applicable or unspecified: Secondary | ICD-10-CM | POA: Insufficient documentation

## 2012-04-18 DIAGNOSIS — O210 Mild hyperemesis gravidarum: Secondary | ICD-10-CM | POA: Insufficient documentation

## 2012-04-18 DIAGNOSIS — N390 Urinary tract infection, site not specified: Secondary | ICD-10-CM | POA: Insufficient documentation

## 2012-04-18 DIAGNOSIS — O2343 Unspecified infection of urinary tract in pregnancy, third trimester: Secondary | ICD-10-CM

## 2012-04-18 LAB — URINALYSIS, ROUTINE W REFLEX MICROSCOPIC
Nitrite: NEGATIVE
Specific Gravity, Urine: 1.02 (ref 1.005–1.030)
Urobilinogen, UA: 1 mg/dL (ref 0.0–1.0)
pH: 6 (ref 5.0–8.0)

## 2012-04-18 LAB — URINE MICROSCOPIC-ADD ON

## 2012-04-18 MED ORDER — NITROFURANTOIN MONOHYD MACRO 100 MG PO CAPS: 100.0000 mg | ORAL_CAPSULE | Freq: Two times a day (BID) | ORAL | Status: DC

## 2012-04-18 MED ORDER — PROMETHAZINE HCL 25 MG PO TABS: 25.0000 mg | ORAL_TABLET | Freq: Four times a day (QID) | ORAL | Status: DC | PRN

## 2012-04-18 NOTE — MAU Note (Signed)
Was feeling contractions today.  Hx of PTL.  abd has been hard, coming stronger, and baby has not been moving today.

## 2012-04-18 NOTE — MAU Provider Note (Signed)
History     CSN: [id number]  Arrival date and time: 04/18/12 1617   First Niclas Markell Initiated Contact with Patient 04/18/12 1731      Chief Complaint  Patient presents with  . Labor Eval  . Decreased Fetal Movement   HPI [name] 27 y.o. [redacted]w[redacted]d  Comes to MAU today with her abdomen getting hard at work.  Vomited her lunch today.  Had diarrhea 2 days ago.  Has had difficulty eating solid food in the past few days.  Is very tired and weak.  Next appointment in the office is 04-25-12.  OB History   Grav Para Term Preterm Abortions TAB SAB Ect Mult Living   4 3 0 3 0  0   2      Past Medical History  Diagnosis Date  . Anemia     Past Surgical History  Procedure Laterality Date  . Tonsillectomy    . Wisdom tooth extraction      Family History  Problem Relation Age of Onset  . Other Neg Hx   . Hypertension Mother   . Hypertension Father   . Hypertension Maternal Grandmother     History  Substance Use Topics  . Smoking status: Never Smoker   . Smokeless tobacco: Never Used  . Alcohol Use: No    Allergies: No Known Allergies  Prescriptions prior to admission  Medication Sig Dispense Refill  . PRESCRIPTION MEDICATION Take 1 capsule by mouth 2 (two) times daily. Iron supplement sample given from dr office        Review of Systems  Constitutional: Negative for fever.  Respiratory: Negative for cough.   Gastrointestinal: Positive for nausea, vomiting, abdominal pain and diarrhea.  Genitourinary:       No vaginal discharge. No vaginal bleeding. No dysuria.   Physical Exam   Blood pressure 112/70, pulse 96, temperature 98.4 F (36.9 C), temperature source Oral, resp. rate 16, weight 85.73 kg (189 lb), last menstrual period 09/24/2011.  Physical Exam  Nursing note and vitals reviewed. Constitutional: She is oriented to person, place, and time. She appears well-developed and well-nourished.  HENT:  Head: Normocephalic.  Eyes: EOM are normal.  Neck:  Neck supple.  GI: Soft. There is no tenderness.  No contractions seen on the monitor FHT baseline 150 with 10X10 accels noted. Patient notes baby is moving since she arrived at MAU.  Musculoskeletal: Normal range of motion.  Neurological: She is alert and oriented to person, place, and time.  Skin: Skin is warm and dry.  Psychiatric: She has a normal mood and affect.    MAU Course  Procedures Results for orders placed during the hospital encounter of 04/18/12 (from the past 24 hour(s))  URINALYSIS, ROUTINE W REFLEX MICROSCOPIC     Status: Abnormal   Collection Time    04/18/12  4:45 PM      Result Value Range   Color, Urine YELLOW  YELLOW   APPearance HAZY (*) CLEAR   Specific Gravity, Urine 1.020  1.005 - 1.030   pH 6.0  5.0 - 8.0   Glucose, UA NEGATIVE  NEGATIVE mg/dL   Hgb urine dipstick NEGATIVE  NEGATIVE   Bilirubin Urine NEGATIVE  NEGATIVE   Ketones, ur NEGATIVE  NEGATIVE mg/dL   Protein, ur NEGATIVE  NEGATIVE mg/dL   Urobilinogen, UA 1.0  0.0 - 1.0 mg/dL   Nitrite NEGATIVE  NEGATIVE   Leukocytes, UA LARGE (*) NEGATIVE  URINE MICROSCOPIC-ADD ON     Status: Abnormal  Collection Time    04/18/12  4:45 PM      Result Value Range   Squamous Epithelial / LPF FEW (*) RARE   WBC, UA 21-50  <3 WBC/hpf   Bacteria, UA MANY (*) RARE   Urine-Other MUCOUS PRESENT      MDM Consult with Dr. Dareen Piano Re: plan of care  Assessment and Plan  UTI in pregnancy Nausea and vomiting  Plan Keep your appointment in the office. RX Phenergan for nausea RX Macrobid 100 mg PO bid x 7 days (#14) no refills Return for further evaluation if you are vomiting and unable to keep down your medication or if you have back pain, fever or body aches.  BURLESON,TERRI 04/18/2012, 6:27 PM

## 2012-04-20 LAB — URINE CULTURE

## 2012-05-30 ENCOUNTER — Encounter (HOSPITAL_COMMUNITY): Payer: Self-pay | Admitting: *Deleted

## 2012-05-30 ENCOUNTER — Inpatient Hospital Stay (HOSPITAL_COMMUNITY)
Admission: AD | Admit: 2012-05-30 | Discharge: 2012-05-30 | Disposition: A | Discharge status: Home / Self Care | Payer: Medicaid Other | Source: Ambulatory Visit | Attending: Obstetrics and Gynecology | Admitting: Obstetrics and Gynecology

## 2012-05-30 DIAGNOSIS — O26899 Other specified pregnancy related conditions, unspecified trimester: Secondary | ICD-10-CM

## 2012-05-30 DIAGNOSIS — R109 Unspecified abdominal pain: Secondary | ICD-10-CM | POA: Insufficient documentation

## 2012-05-30 DIAGNOSIS — N949 Unspecified condition associated with female genital organs and menstrual cycle: Secondary | ICD-10-CM | POA: Insufficient documentation

## 2012-05-30 DIAGNOSIS — O99891 Other specified diseases and conditions complicating pregnancy: Secondary | ICD-10-CM | POA: Insufficient documentation

## 2012-05-30 DIAGNOSIS — N898 Other specified noninflammatory disorders of vagina: Secondary | ICD-10-CM

## 2012-05-30 DIAGNOSIS — O4690 Antepartum hemorrhage, unspecified, unspecified trimester: Secondary | ICD-10-CM | POA: Insufficient documentation

## 2012-05-30 LAB — URINALYSIS, ROUTINE W REFLEX MICROSCOPIC
Ketones, ur: NEGATIVE mg/dL
Nitrite: NEGATIVE
Urobilinogen, UA: 1 mg/dL (ref 0.0–1.0)
pH: 6.5 (ref 5.0–8.0)

## 2012-05-30 LAB — URINE MICROSCOPIC-ADD ON

## 2012-05-30 NOTE — MAU Provider Note (Signed)
History     CSN: [id number]  Arrival date and time: 05/30/12 1730   First Provider Initiated Contact with Patient 05/30/12 1802      Chief Complaint  Patient presents with  . Vaginal Bleeding  . Abdominal Cramping   HPI Tina Lambert is a 27 y.o. [id number] at [redacted]w[redacted]d who presents to MAU today with complaint of mucus and bloody discharge. The patient was checked in the office this morning and reports being 3 cm dilated at that time. She has had a lot of mucus discharge with some blood since then. She had a time period earlier today after her visit when she was feeling regular contractions about 7 minutes apart. The contractions have slowed down since then. She is feeling them occasionally now. She denies LOF and reports good fetal movement.   OB History   Grav Para Term Preterm Abortions TAB SAB Ect Mult Living   4 3 0 3 0  0   2      Past Medical History  Diagnosis Date  . Anemia     Past Surgical History  Procedure Laterality Date  . Tonsillectomy    . Wisdom tooth extraction      Family History  Problem Relation Age of Onset  . Other Neg Hx   . Hypertension Mother   . Hypertension Father   . Hypertension Maternal Grandmother     History  Substance Use Topics  . Smoking status: Never Smoker   . Smokeless tobacco: Never Used  . Alcohol Use: No    Allergies:  Allergies  Allergen Reactions  . Percocet (Oxycodone-Acetaminophen) Nausea And Vomiting    Prescriptions prior to admission  Medication Sig Dispense Refill  . valACYclovir (VALTREX) 500 MG tablet Take 500 mg by mouth daily.        Review of Systems  Constitutional: Negative for fever and malaise/fatigue.  Gastrointestinal: Negative for abdominal pain.  Genitourinary: Negative for dysuria, urgency and frequency.       + vaginal discharge, bleeding   Physical Exam   Blood pressure 122/80, pulse 88, resp. rate 18, last menstrual period 09/24/2011, SpO2 99.00%.  Physical Exam   Constitutional: She is oriented to person, place, and time. She appears well-developed and well-nourished. No distress.  HENT:  Head: Normocephalic and atraumatic.  Cardiovascular: Normal rate, regular rhythm and normal heart sounds.   Respiratory: Effort normal and breath sounds normal. No respiratory distress.  GI: Soft. Bowel sounds are normal. She exhibits no distension and no mass. There is tenderness (mild tenderness to palpation of the lower abdomen). There is no rebound and no guarding.  Genitourinary: Uterus is enlarged (appropriate for GA). Cervix exhibits no motion tenderness and no friability. Vaginal discharge (small amount of mucus discharge. No active bleeding) found.  Neurological: She is alert and oriented to person, place, and time.  Skin: Skin is warm and dry. No erythema.  Psychiatric: She has a normal mood and affect.  Dilation: 3 Effacement (%): 70 Cervical Position: Posterior Station: -3 Presentation: Vertex Exam by:: Naaman Plummer PA  Results for orders placed during the hospital encounter of 05/30/12 (from the past 24 hour(s))  URINALYSIS, ROUTINE W REFLEX MICROSCOPIC     Status: Abnormal   Collection Time    05/30/12  5:40 PM      Result Value Range   Color, Urine YELLOW  YELLOW   APPearance CLOUDY (*) CLEAR   Specific Gravity, Urine 1.025  1.005 - 1.030   pH 6.5  5.0 -  8.0   Glucose, UA NEGATIVE  NEGATIVE mg/dL   Hgb urine dipstick LARGE (*) NEGATIVE   Bilirubin Urine NEGATIVE  NEGATIVE   Ketones, ur NEGATIVE  NEGATIVE mg/dL   Protein, ur NEGATIVE  NEGATIVE mg/dL   Urobilinogen, UA 1.0  0.0 - 1.0 mg/dL   Nitrite NEGATIVE  NEGATIVE   Leukocytes, UA LARGE (*) NEGATIVE  URINE MICROSCOPIC-ADD ON     Status: Abnormal   Collection Time    05/30/12  5:40 PM      Result Value Range   Squamous Epithelial / LPF MANY (*) RARE   WBC, UA 21-50  <3 WBC/hpf   RBC / HPF 0-2  <3 RBC/hpf   Bacteria, UA FEW (*) RARE   Fetal Monitoring: Baseline: 140 bpm, moderate  variability, + accelerations, no decelerations Contractions: occasional  MAU Course  Procedures None  MDM Discussed patient with Dr. Claiborne Billings. She feels confident that the mucus is normal discharge for this stage of pregnancy and that the small amount of blood is from the exam this morning. Patient should keep scheduled follow-up.   Assessment and Plan  A: Vaginal discharge Vaginal bleeding in late pregnancy, post cervical exam  P: Discharge home Patient reassured that this is normal for this stage of pregnancy and with exam today Labor precautions discussed Patient encouraged to keep scheduled follow-up with [facility] Patient may return to MAU as needed or when in labor  Freddi Starr, PA-C  05/30/2012, 6:05 PM

## 2012-05-30 NOTE — Discharge Instructions (Signed)
Braxton Hicks Contractions Pregnancy is commonly associated with contractions of the uterus throughout the pregnancy. Towards the end of pregnancy (32 to 34 weeks), these contractions (Braxton Hicks) can develop more often and may become more forceful. This is not true labor because these contractions do not result in opening (dilatation) and thinning of the cervix. They are sometimes difficult to tell apart from true labor because these contractions can be forceful and people have different pain tolerances. You should not feel embarrassed if you go to the hospital with false labor. Sometimes, the only way to tell if you are in true labor is for your caregiver to follow the changes in the cervix. How to tell the difference between true and false labor:  False labor.  The contractions of false labor are usually shorter, irregular and not as hard as those of true labor.  They are often felt in the front of the lower abdomen and in the groin.  They may leave with walking around or changing positions while lying down.  They get weaker and are shorter lasting as time goes on.  These contractions are usually irregular.  They do not usually become progressively stronger, regular and closer together as with true labor.  True labor.  Contractions in true labor last 30 to 70 seconds, become very regular, usually become more intense, and increase in frequency.  They do not go away with walking.  The discomfort is usually felt in the top of the uterus and spreads to the lower abdomen and low back.  True labor can be determined by your caregiver with an exam. This will show that the cervix is dilating and getting thinner. If there are no prenatal problems or other health problems associated with the pregnancy, it is completely safe to be sent home with false labor and await the onset of true labor. HOME CARE INSTRUCTIONS   Keep up with your usual exercises and instructions.  Take medications as  directed.  Keep your regular prenatal appointment.  Eat and drink lightly if you think you are going into labor.  If BH contractions are making you uncomfortable:  Change your activity position from lying down or resting to walking/walking to resting.  Sit and rest in a tub of warm water.  Drink 2 to 3 glasses of water. Dehydration may cause B-H contractions.  Do slow and deep breathing several times an hour. SEEK IMMEDIATE MEDICAL CARE IF:   Your contractions continue to become stronger, more regular, and closer together.  You have a gushing, burst or leaking of fluid from the vagina.  An oral temperature above 102 F (38.9 C) develops.  You have passage of blood-tinged mucus.  You develop vaginal bleeding.  You develop continuous belly (abdominal) pain.  You have low back pain that you never had before.  You feel the baby's head pushing down causing pelvic pressure.  The baby is not moving as much as it used to. Document Released: 01/23/2005 Document Revised: 04/17/2011 Document Reviewed: 07/17/2008 Exit[employer] Patient Information 2013 [employer].   Fetal Movement Counts Patient Name: __________________________________________________ Patient Due Date: ____________________ Kick counts is highly recommended in high risk pregnancies, but it is a good idea for every pregnant woman to do. Start counting fetal movements at 28 weeks of the pregnancy. Fetal movements increase after eating a full meal or eating or drinking something sweet (the blood sugar is higher). It is also important to drink plenty of fluids (well hydrated) before doing the count. Lie on your   left side because it helps with the circulation or you can sit in a comfortable chair with your arms over your belly (abdomen) with no distractions around you. DOING THE COUNT  Try to do the count the same time of day each time you do it.  Mark the day and time, then see how long it takes for you to feel 10  movements (kicks, flutters, swishes, rolls). You should have at least 10 movements within 2 hours. You will most likely feel 10 movements in much less than 2 hours. If you do not, wait an hour and count again. After a couple of days you will see a pattern.  What you are looking for is a change in the pattern or not enough counts in 2 hours. Is it taking longer in time to reach 10 movements? SEEK MEDICAL CARE IF:  You feel less than 10 counts in 2 hours. Tried twice.  No movement in one hour.  The pattern is changing or taking longer each day to reach 10 counts in 2 hours.  You feel the baby is not moving as it usually does. Date: ____________ Movements: ____________ Start time: ____________ Finish time: ____________  Date: ____________ Movements: ____________ Start time: ____________ Finish time: ____________ Date: ____________ Movements: ____________ Start time: ____________ Finish time: ____________ Date: ____________ Movements: ____________ Start time: ____________ Finish time: ____________ Date: ____________ Movements: ____________ Start time: ____________ Finish time: ____________ Date: ____________ Movements: ____________ Start time: ____________ Finish time: ____________ Date: ____________ Movements: ____________ Start time: ____________ Finish time: ____________ Date: ____________ Movements: ____________ Start time: ____________ Finish time: ____________  Date: ____________ Movements: ____________ Start time: ____________ Finish time: ____________ Date: ____________ Movements: ____________ Start time: ____________ Finish time: ____________ Date: ____________ Movements: ____________ Start time: ____________ Finish time: ____________ Date: ____________ Movements: ____________ Start time: ____________ Finish time: ____________ Date: ____________ Movements: ____________ Start time: ____________ Finish time: ____________ Date: ____________ Movements: ____________ Start time: ____________  Finish time: ____________ Date: ____________ Movements: ____________ Start time: ____________ Finish time: ____________  Date: ____________ Movements: ____________ Start time: ____________ Finish time: ____________ Date: ____________ Movements: ____________ Start time: ____________ Finish time: ____________ Date: ____________ Movements: ____________ Start time: ____________ Finish time: ____________ Date: ____________ Movements: ____________ Start time: ____________ Finish time: ____________ Date: ____________ Movements: ____________ Start time: ____________ Finish time: ____________ Date: ____________ Movements: ____________ Start time: ____________ Finish time: ____________ Date: ____________ Movements: ____________ Start time: ____________ Finish time: ____________  Date: ____________ Movements: ____________ Start time: ____________ Finish time: ____________ Date: ____________ Movements: ____________ Start time: ____________ Finish time: ____________ Date: ____________ Movements: ____________ Start time: ____________ Finish time: ____________ Date: ____________ Movements: ____________ Start time: ____________ Finish time: ____________ Date: ____________ Movements: ____________ Start time: ____________ Finish time: ____________ Date: ____________ Movements: ____________ Start time: ____________ Finish time: ____________ Date: ____________ Movements: ____________ Start time: ____________ Finish time: ____________  Date: ____________ Movements: ____________ Start time: ____________ Finish time: ____________ Date: ____________ Movements: ____________ Start time: ____________ Finish time: ____________ Date: ____________ Movements: ____________ Start time: ____________ Finish time: ____________ Date: ____________ Movements: ____________ Start time: ____________ Finish time: ____________ Date: ____________ Movements: ____________ Start time: ____________ Finish time: ____________ Date: ____________  Movements: ____________ Start time: ____________ Finish time: ____________ Date: ____________ Movements: ____________ Start time: ____________ Finish time: ____________  Date: ____________ Movements: ____________ Start time: ____________ Finish time: ____________ Date: ____________ Movements: ____________ Start time: ____________ Finish time: ____________ Date: ____________ Movements: ____________ Start time: ____________ Finish time: ____________ Date: ____________ Movements: ____________ Start time: ____________ Finish time:   ____________ Date: ____________ Movements: ____________ Start time: ____________ Finish time: ____________ Date: ____________ Movements: ____________ Start time: ____________ Finish time: ____________ Date: ____________ Movements: ____________ Start time: ____________ Finish time: ____________  Date: ____________ Movements: ____________ Start time: ____________ Finish time: ____________ Date: ____________ Movements: ____________ Start time: ____________ Finish time: ____________ Date: ____________ Movements: ____________ Start time: ____________ Finish time: ____________ Date: ____________ Movements: ____________ Start time: ____________ Finish time: ____________ Date: ____________ Movements: ____________ Start time: ____________ Finish time: ____________ Date: ____________ Movements: ____________ Start time: ____________ Finish time: ____________ Date: ____________ Movements: ____________ Start time: ____________ Finish time: ____________  Date: ____________ Movements: ____________ Start time: ____________ Finish time: ____________ Date: ____________ Movements: ____________ Start time: ____________ Finish time: ____________ Date: ____________ Movements: ____________ Start time: ____________ Finish time: ____________ Date: ____________ Movements: ____________ Start time: ____________ Finish time: ____________ Date: ____________ Movements: ____________ Start time:  ____________ Finish time: ____________ Date: ____________ Movements: ____________ Start time: ____________ Finish time: ____________ Document Released: 02/22/2006 Document Revised: 04/17/2011 Document Reviewed: 08/25/2008 [employer] Patient Information 2013 [employer].  

## 2012-05-30 NOTE — MAU Note (Signed)
Was at the dr's office this morning,  Was 3cm when examined. Had a small amt of blood after exam, but it has continued, bloody mucous.

## 2012-06-01 ENCOUNTER — Inpatient Hospital Stay (HOSPITAL_COMMUNITY)
Admission: AD | Admit: 2012-06-01 | Discharge: 2012-06-01 | Disposition: A | Discharge status: Home / Self Care | Payer: Medicaid Other | Source: Ambulatory Visit | Attending: Obstetrics and Gynecology | Admitting: Obstetrics and Gynecology

## 2012-06-01 ENCOUNTER — Encounter (HOSPITAL_COMMUNITY): Payer: Self-pay | Admitting: *Deleted

## 2012-06-01 DIAGNOSIS — O4700 False labor before 37 completed weeks of gestation, unspecified trimester: Secondary | ICD-10-CM | POA: Insufficient documentation

## 2012-06-01 DIAGNOSIS — O99891 Other specified diseases and conditions complicating pregnancy: Secondary | ICD-10-CM | POA: Insufficient documentation

## 2012-06-01 DIAGNOSIS — O4703 False labor before 37 completed weeks of gestation, third trimester: Secondary | ICD-10-CM

## 2012-06-01 DIAGNOSIS — O212 Late vomiting of pregnancy: Secondary | ICD-10-CM | POA: Insufficient documentation

## 2012-06-01 DIAGNOSIS — N390 Urinary tract infection, site not specified: Secondary | ICD-10-CM

## 2012-06-01 DIAGNOSIS — Z22330 Carrier of Group B streptococcus: Secondary | ICD-10-CM | POA: Insufficient documentation

## 2012-06-01 HISTORY — DX: Reserved for concepts with insufficient information to code with codable children: IMO0002

## 2012-06-01 HISTORY — DX: Unspecified infectious disease: B99.9

## 2012-06-01 HISTORY — DX: Dermatitis, unspecified: L30.9

## 2012-06-01 HISTORY — DX: Herpesviral infection of urogenital system, unspecified: A60.00

## 2012-06-01 LAB — URINE CULTURE: Colony Count: >=100000

## 2012-06-01 LAB — URINALYSIS, ROUTINE W REFLEX MICROSCOPIC
Bilirubin Urine: NEGATIVE
Ketones, ur: NEGATIVE mg/dL
Nitrite: NEGATIVE
Protein, ur: NEGATIVE mg/dL
Urobilinogen, UA: 4 mg/dL — ABNORMAL HIGH (ref 0.0–1.0)
pH: 7 (ref 5.0–8.0)

## 2012-06-01 MED ORDER — AMOXICILLIN 500 MG PO CAPS: 500.0000 mg | ORAL_CAPSULE | Freq: Three times a day (TID) | ORAL | Status: DC

## 2012-06-01 NOTE — MAU Provider Note (Signed)
History     CSN: [id number]  Arrival date and time: 06/01/12 4098   First Provider Initiated Contact with Patient 06/01/12 [phone]      Chief Complaint  Patient presents with  . Emesis  . Diarrhea   HPI Tina Lambert 27 y.o. [redacted]w[redacted]d Comes to MAU today with contractions and vomiting this morning.  Has had vomiting almost daily throughout the pregnancy.  Was checked in the office last week and was 3 cm.  Was at MAU on 05-30-12 and was still 3 cm.  Thinks she may have some leaking or some vaginal discharge.  Reports baby is moving well.  Thinks she cannot go to work today.  History of 3 preterm births.  OB History   Grav Para Term Preterm Abortions TAB SAB Ect Mult Living   4 3 0 3 0  0   2      Past Medical History  Diagnosis Date  . Anemia   . Preterm labor   . Infection     UTI  . Eczema   . Abnormal Pap smear   . Genital herpes     Past Surgical History  Procedure Laterality Date  . Tonsillectomy    . Wisdom tooth extraction      Family History  Problem Relation Age of Onset  . Other Neg Hx   . Hypertension Mother   . Hypertension Father   . Hypertension Maternal Grandmother   . Cancer Paternal Uncle     1 prostate, 1 colon    History  Substance Use Topics  . Smoking status: Never Smoker   . Smokeless tobacco: Never Used  . Alcohol Use: No    Allergies:  Allergies  Allergen Reactions  . Percocet (Oxycodone-Acetaminophen) Nausea And Vomiting    Prescriptions prior to admission  Medication Sig Dispense Refill  . Prenatal Vit-Min-FA-Fish Oil ([employer] PRENATAL GUMMY PO) Take 2 tablets by mouth daily.      . valACYclovir (VALTREX) 500 MG tablet Take 500 mg by mouth daily.        Review of Systems  Constitutional: Negative for fever.  Gastrointestinal: Positive for nausea, vomiting, abdominal pain and diarrhea. Negative for constipation.  Genitourinary:       Vaginal discharge. No vaginal bleeding. No dysuria.   Physical Exam   Blood pressure  111/66, pulse 89, temperature 98.5 F (36.9 C), temperature source Oral, resp. rate 18, height 5\' 3"  (1.6 m), weight 193 lb (87.544 kg), last menstrual period 09/24/2011.  Physical Exam  Nursing note and vitals reviewed. Constitutional: She is oriented to person, place, and time. She appears well-developed and well-nourished.  HENT:  Head: Normocephalic.  Eyes: EOM are normal.  Neck: Neck supple.  GI: Soft. There is no tenderness. There is no rebound and no guarding.  Contractions every 20 mintues. FHT baseline 140 with moderate variability Reactive with 15x15 accels seen  Genitourinary:  Speculum exam: Vagina - Mod amount of creamy discharge, no odor Cervix - No contact bleeding, no leaking, no pooling in vagina seen Bimanual exam: Cervix thick and likely open, baby high, likely unchanged from previous exam, client too uncomfortable to be able to completely confirm dilatation Chaperone present for exam.  Musculoskeletal: Normal range of motion.  Neurological: She is alert and oriented to person, place, and time.  Skin: Skin is warm and dry.  Psychiatric: She has a normal mood and affect.    MAU Course  Procedures  MDM Reviewed urine culture report from 05-30-12.  Positive for GBS.  Will treat. 1610  Consult with Dr. Dareen Piano  Assessment and Plan  [redacted]w[redacted]d not in labor,  +GBS on urine culture on 05-30-12, >100,000 col.  Plan Continue Valtrex as you have been taking. Rx Amoxicillin 500 mg PO tid x 7 days (#21) for GBS in Urine Client has Phenergan at home.  Encouraged to use at least 30 prior to antibiotics if needed to be able to keep down medication. Note give to be out of work today and tomorrow.  Follow up in the office on Monday if unable to work. Keep appointment as scheduled in the office on Friday.   Tina Lambert 06/01/2012, 8:59 AM

## 2012-06-01 NOTE — MAU Note (Signed)
Pt reports she started feeling sick yesterday. Has had some vomiting and several episodes of diarrhea

## 2012-06-01 NOTE — MAU Note (Signed)
Saw Dr on Fri,  Had diarrhea prior to visit. And had lost mucous plug; was 3 cm.    Feels drained.  Didn't keep down what she ate this morning.  Increase pelvic pain and decreased fetal movement.

## 2012-06-03 ENCOUNTER — Inpatient Hospital Stay (HOSPITAL_COMMUNITY)
Admission: AD | Admit: 2012-06-03 | Discharge: 2012-06-03 | Disposition: A | Discharge status: Home / Self Care | Payer: Medicaid Other | Source: Ambulatory Visit | Attending: Obstetrics and Gynecology | Admitting: Obstetrics and Gynecology

## 2012-06-03 ENCOUNTER — Encounter (HOSPITAL_COMMUNITY): Payer: Self-pay

## 2012-06-03 DIAGNOSIS — N949 Unspecified condition associated with female genital organs and menstrual cycle: Secondary | ICD-10-CM | POA: Insufficient documentation

## 2012-06-03 DIAGNOSIS — O479 False labor, unspecified: Secondary | ICD-10-CM | POA: Insufficient documentation

## 2012-06-03 LAB — URINALYSIS, ROUTINE W REFLEX MICROSCOPIC
Bilirubin Urine: NEGATIVE
Nitrite: NEGATIVE
Specific Gravity, Urine: 1.025 (ref 1.005–1.030)
Urobilinogen, UA: 1 mg/dL (ref 0.0–1.0)

## 2012-06-03 LAB — WET PREP, GENITAL
Clue Cells Wet Prep HPF POC: NONE SEEN
Yeast Wet Prep HPF POC: NONE SEEN

## 2012-06-03 LAB — URINE MICROSCOPIC-ADD ON

## 2012-06-03 MED ORDER — BUTORPHANOL TARTRATE 1 MG/ML IJ SOLN
1.0000 mg | Freq: Once | INTRAMUSCULAR | Status: AC
  Administered 2012-06-03: 1 mg via INTRAMUSCULAR
  Filled 2012-06-03: qty 1

## 2012-06-03 NOTE — MAU Note (Signed)
Contractions are hitting her hard, lots of pressure.

## 2012-06-03 NOTE — Progress Notes (Signed)
Pt states she feels like the contractions are gone. States she still feels the pelvic pain but that it is the same as it was for the last 2 weeks. Pt states she just wants to go get something to eat.

## 2012-06-03 NOTE — MAU Note (Signed)
Pt states was 3.5cm/85% on Friday. Here for ctx's q5 minutes apart, has increased pelvic pain. Denies bleeding or lof.

## 2012-06-05 LAB — URINE CULTURE: Special Requests: NORMAL

## 2012-06-11 ENCOUNTER — Encounter (HOSPITAL_COMMUNITY): Payer: Self-pay

## 2012-06-11 ENCOUNTER — Inpatient Hospital Stay (HOSPITAL_COMMUNITY)
Admission: AD | Admit: 2012-06-11 | Discharge: 2012-06-11 | Disposition: A | Discharge status: Home / Self Care | Payer: Medicaid Other | Source: Ambulatory Visit | Attending: Obstetrics & Gynecology | Admitting: Obstetrics & Gynecology

## 2012-06-11 DIAGNOSIS — O368190 Decreased fetal movements, unspecified trimester, not applicable or unspecified: Secondary | ICD-10-CM | POA: Insufficient documentation

## 2012-06-11 DIAGNOSIS — O479 False labor, unspecified: Secondary | ICD-10-CM | POA: Insufficient documentation

## 2012-06-11 NOTE — MAU Note (Signed)
Patient is in with c/o ctx q70m for 2 hours. She states that she was 4cm/vtx today by dr Arlyce Dice. She denies vaginal bleeding or lof.

## 2012-06-13 ENCOUNTER — Inpatient Hospital Stay (HOSPITAL_COMMUNITY)
Admission: AD | Admit: 2012-06-13 | Discharge: 2012-06-13 | Disposition: A | Discharge status: Home / Self Care | Payer: Medicaid Other | Source: Ambulatory Visit | Attending: Obstetrics and Gynecology | Admitting: Obstetrics and Gynecology

## 2012-06-13 ENCOUNTER — Encounter (HOSPITAL_COMMUNITY): Payer: Self-pay | Admitting: *Deleted

## 2012-06-13 DIAGNOSIS — O479 False labor, unspecified: Secondary | ICD-10-CM | POA: Insufficient documentation

## 2012-06-13 NOTE — Progress Notes (Addendum)
Patient called to nurses station asking for tray to be picked up after dinner, called a second time approximately 1-2 min later yelling at secretary that tray was not picked up yet. Charge RN informed patient that yelling at staff is not appropriate and staff will be in to address needs when able. Pt states "well if they do their damn jobs they wont have to be yelled at". Informed patient that if she continues to yell staff will be instructed to leave room and allow patient to cool off and will reenter when behavior is appropriate. Patient continued to yell and RN left room. Communicated expectations to oncoming shift.

## 2012-06-13 NOTE — MAU Note (Signed)
Contractions every 3 min.  ? Leaking last night.  Stood up and some water came out.  None since. No bleeding.

## 2012-06-24 ENCOUNTER — Inpatient Hospital Stay (HOSPITAL_COMMUNITY): Payer: Medicaid Other | Admitting: Anesthesiology

## 2012-06-24 ENCOUNTER — Encounter (HOSPITAL_COMMUNITY): Payer: Self-pay | Admitting: Anesthesiology

## 2012-06-24 ENCOUNTER — Inpatient Hospital Stay (HOSPITAL_COMMUNITY)
Admission: AD | Admit: 2012-06-24 | Discharge: 2012-06-26 | DRG: 774 | Disposition: A | Discharge status: Home / Self Care | Payer: Medicaid Other | Source: Ambulatory Visit | Attending: Obstetrics and Gynecology | Admitting: Obstetrics and Gynecology

## 2012-06-24 ENCOUNTER — Encounter (HOSPITAL_COMMUNITY): Payer: Self-pay | Admitting: *Deleted

## 2012-06-24 DIAGNOSIS — A6000 Herpesviral infection of urogenital system, unspecified: Secondary | ICD-10-CM | POA: Diagnosis present

## 2012-06-24 DIAGNOSIS — O99892 Other specified diseases and conditions complicating childbirth: Secondary | ICD-10-CM | POA: Diagnosis present

## 2012-06-24 DIAGNOSIS — O98519 Other viral diseases complicating pregnancy, unspecified trimester: Secondary | ICD-10-CM | POA: Diagnosis present

## 2012-06-24 DIAGNOSIS — Z22330 Carrier of Group B streptococcus: Secondary | ICD-10-CM

## 2012-06-24 DIAGNOSIS — O365990 Maternal care for other known or suspected poor fetal growth, unspecified trimester, not applicable or unspecified: Principal | ICD-10-CM | POA: Diagnosis present

## 2012-06-24 DIAGNOSIS — Z3480 Encounter for supervision of other normal pregnancy, unspecified trimester: Secondary | ICD-10-CM

## 2012-06-24 LAB — TYPE AND SCREEN
ABO/RH(D): O POS
Antibody Screen: NEGATIVE

## 2012-06-24 LAB — CBC
Hemoglobin: 11.2 g/dL — ABNORMAL LOW (ref 12.0–15.0)
MCH: 26.1 pg (ref 26.0–34.0)
MCV: 79.3 fL (ref 78.0–100.0)
RBC: 4.29 MIL/uL (ref 3.87–5.11)

## 2012-06-24 MED ORDER — LACTATED RINGERS IV SOLN: INTRAVENOUS | Status: DC

## 2012-06-24 MED ORDER — LIDOCAINE HCL (PF) 1 % IJ SOLN
30.0000 mL | INTRAMUSCULAR | Status: DC | PRN
  Filled 2012-06-24: qty 30

## 2012-06-24 MED ORDER — ONDANSETRON HCL 4 MG/2ML IJ SOLN: 4.0000 mg | INTRAMUSCULAR | Status: DC | PRN

## 2012-06-24 MED ORDER — DIPHENHYDRAMINE HCL 50 MG/ML IJ SOLN: 12.5000 mg | INTRAMUSCULAR | Status: DC | PRN

## 2012-06-24 MED ORDER — FENTANYL 2.5 MCG/ML BUPIVACAINE 1/10 % EPIDURAL INFUSION (WH - ANES)
14.0000 mL/h | INTRAMUSCULAR | Status: DC | PRN
  Filled 2012-06-24: qty 125

## 2012-06-24 MED ORDER — CITRIC ACID-SODIUM CITRATE 334-500 MG/5ML PO SOLN: 30.0000 mL | ORAL | Status: DC | PRN

## 2012-06-24 MED ORDER — TETANUS-DIPHTH-ACELL PERTUSSIS 5-2.5-18.5 LF-MCG/0.5 IM SUSP: 0.5000 mL | Freq: Once | INTRAMUSCULAR | Status: DC

## 2012-06-24 MED ORDER — FENTANYL 2.5 MCG/ML BUPIVACAINE 1/10 % EPIDURAL INFUSION (WH - ANES)
INTRAMUSCULAR | Status: DC | PRN
  Administered 2012-06-24: 14 mL/h via EPIDURAL

## 2012-06-24 MED ORDER — ONDANSETRON HCL 4 MG PO TABS: 4.0000 mg | ORAL_TABLET | ORAL | Status: DC | PRN

## 2012-06-24 MED ORDER — PHENYLEPHRINE 40 MCG/ML (10ML) SYRINGE FOR IV PUSH (FOR BLOOD PRESSURE SUPPORT)
80.0000 ug | PREFILLED_SYRINGE | INTRAVENOUS | Status: DC | PRN
  Filled 2012-06-24: qty 2
  Filled 2012-06-24: qty 5

## 2012-06-24 MED ORDER — EPHEDRINE 5 MG/ML INJ
10.0000 mg | INTRAVENOUS | Status: DC | PRN
  Administered 2012-06-24: 10 mg via INTRAVENOUS
  Filled 2012-06-24: qty 2

## 2012-06-24 MED ORDER — ACETAMINOPHEN 325 MG PO TABS: 650.0000 mg | ORAL_TABLET | ORAL | Status: DC | PRN

## 2012-06-24 MED ORDER — LACTATED RINGERS IV SOLN: 500.0000 mL | Freq: Once | INTRAVENOUS | Status: DC

## 2012-06-24 MED ORDER — LACTATED RINGERS IV SOLN: 500.0000 mL | INTRAVENOUS | Status: DC | PRN

## 2012-06-24 MED ORDER — LIDOCAINE HCL (PF) 1 % IJ SOLN
INTRAMUSCULAR | Status: DC | PRN
  Administered 2012-06-24 (×2): 4 mL

## 2012-06-24 MED ORDER — COMPLETENATE 29-1 MG PO CHEW
1.0000 | CHEWABLE_TABLET | Freq: Every day | ORAL | Status: DC
  Administered 2012-06-25: 1 via ORAL
  Filled 2012-06-24 (×2): qty 1

## 2012-06-24 MED ORDER — IBUPROFEN 100 MG/5ML PO SUSP
600.0000 mg | Freq: Four times a day (QID) | ORAL | Status: DC
  Administered 2012-06-25 – 2012-06-26 (×6): 600 mg via ORAL
  Filled 2012-06-24 (×7): qty 30

## 2012-06-24 MED ORDER — OXYTOCIN 40 UNITS IN LACTATED RINGERS INFUSION - SIMPLE MED
62.5000 mL/h | INTRAVENOUS | Status: DC
  Administered 2012-06-24: 999 mL/h via INTRAVENOUS
  Filled 2012-06-24: qty 1000

## 2012-06-24 MED ORDER — SENNOSIDES-DOCUSATE SODIUM 8.6-50 MG PO TABS
2.0000 | ORAL_TABLET | Freq: Every day | ORAL | Status: DC
  Administered 2012-06-24: 2 via ORAL

## 2012-06-24 MED ORDER — EPHEDRINE 5 MG/ML INJ
10.0000 mg | INTRAVENOUS | Status: DC | PRN
  Filled 2012-06-24: qty 2
  Filled 2012-06-24: qty 4

## 2012-06-24 MED ORDER — IBUPROFEN 600 MG PO TABS: 600.0000 mg | ORAL_TABLET | Freq: Four times a day (QID) | ORAL | Status: DC | PRN

## 2012-06-24 MED ORDER — ONDANSETRON HCL 4 MG/2ML IJ SOLN: 4.0000 mg | Freq: Four times a day (QID) | INTRAMUSCULAR | Status: DC | PRN

## 2012-06-24 MED ORDER — ZOLPIDEM TARTRATE 5 MG PO TABS: 5.0000 mg | ORAL_TABLET | Freq: Every evening | ORAL | Status: DC | PRN

## 2012-06-24 MED ORDER — SODIUM CHLORIDE 0.9 % IV SOLN
2.0000 g | Freq: Once | INTRAVENOUS | Status: DC
  Filled 2012-06-24: qty 2000

## 2012-06-24 MED ORDER — IBUPROFEN 600 MG PO TABS
600.0000 mg | ORAL_TABLET | Freq: Four times a day (QID) | ORAL | Status: DC
  Administered 2012-06-24: 600 mg via ORAL
  Filled 2012-06-24: qty 1

## 2012-06-24 MED ORDER — WITCH HAZEL-GLYCERIN EX PADS: 1.0000 "application " | MEDICATED_PAD | CUTANEOUS | Status: DC | PRN

## 2012-06-24 MED ORDER — PHENYLEPHRINE 40 MCG/ML (10ML) SYRINGE FOR IV PUSH (FOR BLOOD PRESSURE SUPPORT)
80.0000 ug | PREFILLED_SYRINGE | INTRAVENOUS | Status: DC | PRN
  Filled 2012-06-24: qty 2

## 2012-06-24 MED ORDER — LANOLIN HYDROUS EX OINT: TOPICAL_OINTMENT | CUTANEOUS | Status: DC | PRN

## 2012-06-24 MED ORDER — OXYTOCIN BOLUS FROM INFUSION: 500.0000 mL | INTRAVENOUS | Status: DC

## 2012-06-24 MED ORDER — DIPHENHYDRAMINE HCL 25 MG PO CAPS: 25.0000 mg | ORAL_CAPSULE | Freq: Four times a day (QID) | ORAL | Status: DC | PRN

## 2012-06-24 MED ORDER — FLEET ENEMA 7-19 GM/118ML RE ENEM: 1.0000 | ENEMA | Freq: Once | RECTAL | Status: DC

## 2012-06-24 MED ORDER — OXYCODONE-ACETAMINOPHEN 5-325 MG PO TABS: 1.0000 | ORAL_TABLET | ORAL | Status: DC | PRN

## 2012-06-24 MED ORDER — DIBUCAINE 1 % RE OINT: 1.0000 "application " | TOPICAL_OINTMENT | RECTAL | Status: DC | PRN

## 2012-06-24 MED ORDER — PRENATAL MULTIVITAMIN CH
1.0000 | ORAL_TABLET | Freq: Every day | ORAL | Status: DC
  Administered 2012-06-24: 1 via ORAL
  Filled 2012-06-24: qty 1

## 2012-06-24 MED ORDER — SIMETHICONE 80 MG PO CHEW: 80.0000 mg | CHEWABLE_TABLET | ORAL | Status: DC | PRN

## 2012-06-24 MED ORDER — BENZOCAINE-MENTHOL 20-0.5 % EX AERO: 1.0000 "application " | INHALATION_SPRAY | CUTANEOUS | Status: DC | PRN

## 2012-06-24 NOTE — H&P (Signed)
27 y.o. [redacted]w[redacted]d  [id number] comes in c/o ctx.  Otherwise has good fetal movement and no bleeding.  She reports being treated for a herpes lesion on her right upper thigh for several weeks and it is now going away but not completely gone.   She was told by a prior physician that she could have a vaginal delivery safely with the lesion covered.  She denies any other outbreaks or prodromal symptoms of vaginal or vulva.  Past Medical History  Diagnosis Date  . Anemia   . Preterm labor   . Infection     UTI  . Eczema   . Abnormal Pap smear   . Genital herpes     Past Surgical History  Procedure Laterality Date  . Tonsillectomy    . Wisdom tooth extraction      OB History   Grav Para Term Preterm Abortions TAB SAB Ect Mult Living   4 3 0 3 0  0   2     # Outc Date GA Lbr Len/2nd Wgt Sex Del Anes PTL Lv   1 PRE 2007 [redacted]w[redacted]d    SVD None Yes ND   2 PRE 2009 [redacted]w[redacted]d   F SVD None No Yes   3 PRE 2011 [redacted]w[redacted]d   F SVD None Yes Yes   4 CUR               History   Social History  . Marital Status: Single    Spouse Name: N/A    Number of Children: N/A  . Years of Education: N/A   Occupational History  . Not on file.   Social History Main Topics  . Smoking status: Never Smoker   . Smokeless tobacco: Never Used  . Alcohol Use: No  . Drug Use: No  . Sexually Active: Yes    Birth Control/ Protection: None   Other Topics Concern  . Not on file   Social History Narrative  . No narrative on file   Percocet    Prenatal Transfer Tool  Maternal Diabetes: No Genetic Screening: Normal Maternal Ultrasounds/Referrals: Normal Fetal Ultrasounds or other Referrals:  None Maternal Substance Abuse:  No Significant Maternal Medications:  Meds include: Other:  Significant Maternal Lab Results: Lab values include: Group B Strep positive  Other PNC:    Filed Vitals:   06/24/12 0539  BP: 130/84  Pulse: 77  Temp:   Resp:      Lungs/Cor:  NAD Abdomen:  soft, gravid Ex:  no cords,  erythema SVE:  6/90/-1, SSE: no lesions of cervix, vaginal, vulva noted.  Right thigh lesion noted, is now completely covered with tegaderm. FHTs:  140, 10x10 accels Toco:  q 3-5, initially not picking up well   A/P  Admit to L&D Discussed R/B of c/s vs. Vaginal delivery with patient and I agree with Dr. Marzetta Board assessment that vaginal delivery poses minimal risk to baby from herpes standpoint.  Pt has been taking valtrex, is having no new symptoms, lesion is easily covered and away from the vaginal and vulva.  After discussion and questions answered pt would like to proceed with vaginal delivery.  GBS Pos - 2g Ampicillin asap for suspected imminent delivery. Patient desires epidural.    Claiborne Billings, Luther Parody

## 2012-06-24 NOTE — MAU Note (Signed)
Tegraderm placed over lesion on rt inner thigh-Dr Claiborne Billings present and did a speculum exam-discussed options with pt and have agreed to a vaginal delivery

## 2012-06-24 NOTE — Anesthesia Postprocedure Evaluation (Signed)
Anesthesia Post Note  Patient: Tina Lambert  Procedure(s) Performed: * No procedures listed *  Anesthesia type: Epidural  Patient location: Mother/Baby  Post pain: Pain level controlled  Post assessment: Post-op Vital signs reviewed  Last Vitals:  Filed Vitals:   06/24/12 1330  BP: 118/78  Pulse: 88  Temp: 36.9 C  Resp: 16    Post vital signs: Reviewed  Level of consciousness:alert  Complications: No apparent anesthesia complications

## 2012-06-24 NOTE — Anesthesia Preprocedure Evaluation (Signed)
Anesthesia Evaluation  Patient identified by MRN, date of birth, ID band Patient awake    Reviewed: Allergy & Precautions, H&P , Patient's Chart, lab work & pertinent test results  Airway Mallampati: III TM Distance: >3 FB Neck ROM: full    Dental no notable dental hx. (+) Teeth Intact   Pulmonary neg pulmonary ROS,  breath sounds clear to auscultation  Pulmonary exam normal       Cardiovascular negative cardio ROS  Rhythm:regular Rate:Normal     Neuro/Psych negative neurological ROS  negative psych ROS   GI/Hepatic negative GI ROS, Neg liver ROS,   Endo/Other  Obesity  Renal/GU negative Renal ROS  negative genitourinary   Musculoskeletal   Abdominal Normal abdominal exam  (+)   Peds  Hematology negative hematology ROS (+) anemia ,   Anesthesia Other Findings   Reproductive/Obstetrics (+) Pregnancy                           Anesthesia Physical Anesthesia Plan  ASA: II  Anesthesia Plan: Epidural   Post-op Pain Management:    Induction:   Airway Management Planned:   Additional Equipment:   Intra-op Plan:   Post-operative Plan:   Informed Consent: I have reviewed the patients History and Physical, chart, labs and discussed the procedure including the risks, benefits and alternatives for the proposed anesthesia with the patient or authorized representative who has indicated his/her understanding and acceptance.     Plan Discussed with: Anesthesiologist  Anesthesia Plan Comments:         Anesthesia Quick Evaluation

## 2012-06-24 NOTE — MAU Note (Signed)
To observe pt for 1 hour and recheck per Dr Claiborne Billings

## 2012-06-24 NOTE — Anesthesia Procedure Notes (Signed)
Epidural Patient location during procedure: OB Start time: 06/24/2012 7:45 AM  Staffing Anesthesiologist: Jagger Beahm A. Performed by: anesthesiologist   Preanesthetic Checklist Completed: patient identified, site marked, surgical consent, pre-op evaluation, timeout performed, IV checked, risks and benefits discussed and monitors and equipment checked  Epidural Patient position: sitting Prep: site prepped and draped and DuraPrep Patient monitoring: continuous pulse ox and blood pressure Approach: midline Injection technique: LOR air  Needle:  Needle type: Tuohy  Needle gauge: 17 G Needle length: 9 cm and 9 Needle insertion depth: 6 cm Catheter type: closed end flexible Catheter size: 19 Gauge Catheter at skin depth: 11 cm Test dose: negative and Other  Assessment Events: blood not aspirated, injection not painful, no injection resistance, negative IV test and no paresthesia  Additional Notes Patient identified. Risks and benefits discussed including failed block, incomplete  Pain control, post dural puncture headache, nerve damage, paralysis, blood pressure Changes, nausea, vomiting, reactions to medications-both toxic and allergic and post Partum back pain. All questions were answered. Patient expressed understanding and wished to proceed. Sterile technique was used throughout procedure. Epidural site was Dressed with sterile barrier dressing. No paresthesias, signs of intravascular injection Or signs of intrathecal spread were encountered.  Patient was more comfortable after the epidural was dosed. Please see RN's note for documentation of vital signs and FHR which are stable.

## 2012-06-24 NOTE — Lactation Note (Signed)
This note was copied from the chart of Tina Lambert. Lactation Consultation Note  Patient Name: Tina Estee Yohe Today's Date: 06/24/2012 Reason for consult: Initial assessment   Maternal Data Formula Feeding for Exclusion: Yes Reason for exclusion: Mother's choice to formula and breast feed on admission Infant to breast within first hour of birth:  (attempt) Has patient been taught Hand Expression?: Yes Does the patient have breastfeeding experience prior to this delivery?: Yes  Feeding   LATCH Score/Interventions                      Lactation Tools Discussed/Used     Consult Status Consult Status: Follow-up Date: 06/25/12 Follow-up type: In-patient  Experienced BF mom reports that baby's blood sugar has been low and he got formula at the last feeding. Last fed at 12 noon. Reports that she tried feeding about 1/2 hour ago and baby would not latch. Offered assist with latch but mom wants to give formula at this feeding to make sure his sugar doesn't get too low. Mom reports that her last baby did not feed well in hospital but did great after she went home and nursed for 3 years. BF brochure given with resources for support after DC. Encouraged to call for assist prn.  Pamelia Hoit 06/24/2012, 3:14 PM

## 2012-06-24 NOTE — MAU Note (Signed)
Pt states she has an active herpes lesion on her inner thigh and was told she could deliver vaginally if it was covered-she states she does not want to deliver vaginally-she wants a C/S to be on the safe side

## 2012-06-24 NOTE — Progress Notes (Signed)
Patient received epidural and is now comfortable.   First dose ampicillin IV complete for >17min. AROM clear, small amount. 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 Cont current mgmt.

## 2012-06-25 LAB — CBC
HCT: 30 % — ABNORMAL LOW (ref 36.0–46.0)
Hemoglobin: 9.9 g/dL — ABNORMAL LOW (ref 12.0–15.0)
MCH: 26.2 pg (ref 26.0–34.0)
MCHC: 33 g/dL (ref 30.0–36.0)
RBC: 3.78 MIL/uL — ABNORMAL LOW (ref 3.87–5.11)

## 2012-06-25 NOTE — Progress Notes (Signed)
CSW met with pt briefly to assess her housing situation, after it was reported that pt expects to be evicted tomorrow. Pt told CSW that her current apartment complex is being condemn by the City [place]. She does not have a working stove & states she has "rats." Pt is currently working with R. [employer] at [employer] & they are working to help pt move into [place] apartments. Pt told CSW that she has paid a $50 deposit & $191 for the electricity bill. Pt owes $525 before she can secure the apartment, pending an approved inspection (by housing [employer] inspector). Mrs. Lawhorn is not sure when the inspector will inspect the apartment. If the inspection is approved, the housing coalition will assist with balance owed. When pt leaves here, she plans to go home & finish packing. Pt's mother was present in the room & told CSW that the pt & the children could come live with here until apartment is secured. FOB was at the bedside asleep. He is not welcome at the pt's mothers home. Pt has an open CPS case with Lisa Courtney. CSW called to inform her of pt's current situation. She told CSW that she is aware & plan to call pt tomorrow to follow up. CPS worker does not have any concerns about the infant discharging with [facility].      

## 2012-06-25 NOTE — Progress Notes (Signed)
Parents desire circ in office. 

## 2012-06-25 NOTE — Progress Notes (Signed)
Patient is eating, ambulating, voiding.  Pain control is good.  Filed Vitals:   06/24/12 1330 06/24/12 1730 06/25/12 0059 06/25/12 0553  BP: 118/78 117/75 110/71 113/77  Pulse: 88 82 85 88  Temp: 98.5 F (36.9 C) 98.2 F (36.8 C) 98.6 F (37 C) 98.3 F (36.8 C)  TempSrc: Oral Oral Oral Oral  Resp: 16 18 16 18   Height:      Weight:      SpO2: 100% 99% 99%     Fundus firm Perineum without swelling.  Lab Results  Component Value Date   WBC 6.9 06/25/2012   HGB 9.9* 06/25/2012   HCT 30.0* 06/25/2012   MCV 79.4 06/25/2012   PLT 217 06/25/2012    --/--/O POS (05/19 0535)/RI  A/P Post partum day 1.  Routine care.  Expect d/c tomorrow.    Sagar Tengan A

## 2012-06-26 NOTE — Progress Notes (Signed)
PPD#2 Pt and baby are doing well Will discharge to home.

## 2012-06-26 NOTE — Progress Notes (Signed)
UR chart review completed.  

## 2012-06-26 NOTE — Discharge Summary (Signed)
Obstetric Discharge Summary Reason for Admission: onset of labor Prenatal Procedures: ultrasound Intrapartum Procedures: spontaneous vaginal delivery Postpartum Procedures: none Complications-Operative and Postpartum: HSV Hemoglobin  Date Value Range Status  06/25/2012 9.9* 12.0 - 15.0 g/dL Final     HCT  Date Value Range Status  06/25/2012 30.0* 36.0 - 46.0 % Final    Physical Exam:  General: alert Lochia: appropriate Uterine Fundus: firm   Discharge Diagnoses: Term Pregnancy-delivered and HSV and SGA  Discharge Information: Date: 06/26/2012 Activity: pelvic rest Diet: routine Medications: PNV and Ibuprofen Condition: stable Instructions: refer to practice specific booklet Discharge to: home Follow-up Information   Follow up with Mickel Baas, MD. Schedule an appointment as soon as possible for a visit in 4 weeks.   Contact information:   [address] [place] [address] [phone]       Newborn Data: Live born female  Birth Weight: 5 lb 1.1 oz (2300 g) APGAR: 7, 8  Home with mother.  Kambria Grima E 06/26/2012, 8:19 AM

## 2012-10-13 ENCOUNTER — Inpatient Hospital Stay (HOSPITAL_COMMUNITY)
Admission: AD | Admit: 2012-10-13 | Discharge: 2012-10-13 | Disposition: A | Discharge status: Home / Self Care | Payer: Medicaid Other | Source: Ambulatory Visit | Attending: Obstetrics & Gynecology | Admitting: Obstetrics & Gynecology

## 2012-10-13 DIAGNOSIS — T8339XA Other mechanical complication of intrauterine contraceptive device, initial encounter: Secondary | ICD-10-CM | POA: Insufficient documentation

## 2012-10-13 LAB — URINALYSIS, ROUTINE W REFLEX MICROSCOPIC
Bilirubin Urine: NEGATIVE
Ketones, ur: NEGATIVE mg/dL
Nitrite: NEGATIVE
Protein, ur: NEGATIVE mg/dL
Urobilinogen, UA: 2 mg/dL — ABNORMAL HIGH (ref 0.0–1.0)
pH: 7.5 (ref 5.0–8.0)

## 2012-10-13 LAB — URINE MICROSCOPIC-ADD ON

## 2012-10-13 NOTE — MAU Provider Note (Signed)
CC: IUD came out  and Possible Pregnancy    First Provider Initiated Contact with Patient 10/13/12 0851      HPI Tina Lambert is a 27 y.o. [phone]  removed her Mirena IUD 4 days ago after feeling the device in her vagina. She states her partner has felt the device during intercourse ever since it was inserted on 08/21/12 at 6 weeks postpartum. She had light spotting from the time of insertion until 09/19/12 and has not had a normal menstrual period. Denies pain, bleeding, abnormal vaginal discharge. HPT was positive. Bottle feeding.    Past Medical History  Diagnosis Date  . Anemia   . Preterm labor   . Infection     UTI  . Eczema   . Abnormal Pap smear   . Genital herpes     OB History  Gravida Para Term Preterm AB SAB TAB Ectopic Multiple Living  4 4 1 3  0 0    3    # Outcome Date GA Lbr Len/2nd Weight Sex Delivery Anes PTL Lv  4 TRM 06/24/12 [redacted]w[redacted]d 05:38 / 00:14 2.3 kg (5 lb 1.1 oz) M SVD EPI  Y     Comments: None  3 PRE 2011 [redacted]w[redacted]d 08:00 2.325 kg (5 lb 2 oz) F SVD None Y Y  2 PRE 2009 [redacted]w[redacted]d 16:00 3.118 kg (6 lb 14 oz) F SVD None N Y  1 PRE 2007 [redacted]w[redacted]d   F SVD None Y ND      Past Surgical History  Procedure Laterality Date  . Tonsillectomy    . Wisdom tooth extraction      History   Social History  . Marital Status: Single    Spouse Name: N/A    Number of Children: N/A  . Years of Education: N/A   Occupational History  . Not on file.   Social History Main Topics  . Smoking status: Never Smoker   . Smokeless tobacco: Never Used  . Alcohol Use: No  . Drug Use: No  . Sexual Activity: Yes    Birth Control/ Protection: None   Other Topics Concern  . Not on file   Social History Narrative  . No narrative on file    No current facility-administered medications on file prior to encounter.   Current Outpatient Prescriptions on File Prior to Encounter  Medication Sig Dispense Refill  . valACYclovir (VALTREX) 500 MG tablet Take 500 mg by mouth 3 (three)  times daily.         Allergies  Allergen Reactions  . Percocet [Oxycodone-Acetaminophen] Nausea And Vomiting    ROS Pertinent items in HPI  PHYSICAL EXAM Filed Vitals:   10/13/12 0754  BP: 135/78  Pulse: 77  Temp: 98.1 F (36.7 C)  Resp: 16   General: Well nourished, well developed female in no acute distress Cardiovascular: Normal rate Respiratory: Normal effort Abdomen: Soft, nontender Back: No CVAT Extremities: No edema Neurologic: Alert and oriented    LAB RESULTS Results for orders placed during the hospital encounter of 10/13/12 (from the past 24 hour(s))  URINALYSIS, ROUTINE W REFLEX MICROSCOPIC     Status: Abnormal   Collection Time    10/13/12  7:44 AM      Result Value Range   Color, Urine YELLOW  YELLOW   APPearance HAZY (*) CLEAR   Specific Gravity, Urine 1.020  1.005 - 1.030   pH 7.5  5.0 - 8.0   Glucose, UA NEGATIVE  NEGATIVE mg/dL   Hgb urine dipstick  NEGATIVE  NEGATIVE   Bilirubin Urine NEGATIVE  NEGATIVE   Ketones, ur NEGATIVE  NEGATIVE mg/dL   Protein, ur NEGATIVE  NEGATIVE mg/dL   Urobilinogen, UA 2.0 (*) 0.0 - 1.0 mg/dL   Nitrite NEGATIVE  NEGATIVE   Leukocytes, UA LARGE (*) NEGATIVE  URINE MICROSCOPIC-ADD ON     Status: Abnormal   Collection Time    10/13/12  7:44 AM      Result Value Range   Squamous Epithelial / LPF FEW (*) RARE   WBC, UA 7-10  <3 WBC/hpf   Bacteria, UA FEW (*) RARE   Urine-Other MUCOUS PRESENT    HCG, QUANTITATIVE, PREGNANCY     Status: None   Collection Time    10/13/12  8:12 AM      Result Value Range   hCG, Beta Chain, Quant, S <1  <5 mIU/mL      ASSESSMENT  1. IUD mechanical complication, initial encounter     PLAN C/W Dr. Arlyce Dice Discharge home with reassurance she is not pregnant Abstinence or condoms until office visit See AVS for patient education.    Medication List         valACYclovir 500 MG tablet  Commonly known as:  VALTREX  Take 500 mg by mouth 3 (three) times daily.        Follow-up Information   Schedule an appointment as soon as possible for a visit with Mickel Baas, MD. (Call for appointment for contraception )    Specialty:  Obstetrics and Gynecology   Contact information:   [address] [place] [address] [phone]        Danae Orleans, CNM 10/13/2012 8:56 AM

## 2012-10-13 NOTE — MAU Note (Signed)
Pt states IUD came out 2 weeks ago, last had unprotected sex 3-4 days ago. Denies abnormal vaginal discharge. Hx HSV, no recent outbreaks. Last menstrual cycle 09/21/2012, has had nausea, mood swings. Unsure if she is pregnant. Having lower abd pain/cramping that began last week. Has positive upt last pm.

## 2012-10-13 NOTE — MAU Note (Signed)
Patient presents to MAU with c/o IUD slipping into vagina four days ago; reports IUD insertion on 7/16 with painful sexual intercourse since insertion; reports she bled from 7/20 until 8/14.  +HPT yesterday. Reports mild abdominal cramping x 4 days. Denies vaginal bleeding or vaginal discharge changes.

## 2012-10-14 LAB — URINE CULTURE: Colony Count: 90000

## 2013-02-06 NOTE — L&D Delivery Note (Signed)
I was present after delivery.  Tina Lambert [name]

## 2013-02-06 NOTE — L&D Delivery Note (Signed)
Delivery Note I was called to attend this delivery due to rapid progression after receiving her epidural At 4:16 AM a viable female was delivered via Vaginal, Spontaneous Delivery (Presentation: Left Occiput Anterior).Delivery by C. Jamesetta Orleans, RN under my supervision.  APGAR: 9, 9; weight 5 lb 2.7 oz (2345 g).  40 units of pitocin diluted in 1000cc LR was infused rapidly IV.  The placenta separated spontaneously and delivered via CCT and maternal pushing effort.  It was inspected and appears to be intact with a 3 VC.  There were the following complications: none  40 units of pitocin diluted in 1000cc LR was infused rapidly IV.  The placenta separated spontaneously and delivered via CCT and maternal pushing effort.  It was inspected and appears to be intact with a 3 VC.   Anesthesia: Epidural  Episiotomy: None Lacerations: None Suture Repair: n/a Est. Blood Loss (mL): 14  Mom to postpartum.  Baby to Couplet care / Skin to Skin.  CRESENZO-DISHMAN,Nesa Distel 10/03/2013, 4:35 AM

## 2013-02-11 ENCOUNTER — Inpatient Hospital Stay (HOSPITAL_COMMUNITY)
Admission: AD | Admit: 2013-02-11 | Discharge: 2013-02-11 | Disposition: A | Discharge status: Home / Self Care | Payer: Medicaid Other | Source: Ambulatory Visit | Attending: Obstetrics and Gynecology | Admitting: Obstetrics and Gynecology

## 2013-02-11 ENCOUNTER — Encounter (HOSPITAL_COMMUNITY): Payer: Self-pay | Admitting: *Deleted

## 2013-02-11 DIAGNOSIS — Z3201 Encounter for pregnancy test, result positive: Secondary | ICD-10-CM

## 2013-02-11 LAB — POCT PREGNANCY, URINE: PREG TEST UR: POSITIVE — AB

## 2013-02-11 NOTE — MAU Note (Signed)
Patient states she has had 2 positive home pregnancy tests. Denies any pain, bleeding or discharge, nausea or vomiting.

## 2013-02-11 NOTE — Discharge Instructions (Signed)
ABCs of Pregnancy  A  Antepartum care is very important. Be sure you see your doctor and get prenatal care as soon as you think you are pregnant. At this time, you will be tested for infection, genetic abnormalities and potential problems with you and the pregnancy. This is the time to discuss diet, exercise, work, medications, labor, pain medication during labor and the possibility of a cesarean delivery. Ask any questions that may concern you. It is important to see your doctor regularly throughout your pregnancy. Avoid exposure to toxic substances and chemicals - such as cleaning solvents, lead and mercury, some insecticides, and paint. Pregnant women should avoid exposure to paint fumes, and fumes that cause you to feel ill, dizzy or faint. When possible, it is a good idea to have a pre-pregnancy consultation with your caregiver to begin some important recommendations your caregiver suggests such as, taking folic acid, exercising, quitting smoking, avoiding alcoholic beverages, etc.  B  Breastfeeding is the healthiest choice for both you and your baby. It has many nutritional benefits for the baby and health benefits for the mother. It also creates a very tight and loving bond between the baby and mother. Talk to your doctor, your family and friends, and your employer about how you choose to feed your baby and how they can support you in your decision. Not all birth defects can be prevented, but a woman can take actions that may increase her chance of having a healthy baby. Many birth defects happen very early in pregnancy, sometimes before a woman even knows she is pregnant. Birth defects or abnormalities of any child in your or the father's family should be discussed with your caregiver. Get a good support bra as your breast size changes. Wear it especially when you exercise and when nursing.   C  Celebrate the news of your pregnancy with the your spouse/father and family. Childbirth classes are helpful to  take for you and the spouse/father because it helps to understand what happens during the pregnancy, labor and delivery. Cesarean delivery should be discussed with your doctor so you are prepared for that possibility. The pros and cons of circumcision if it is a boy, should be discussed with your pediatrician. Cigarette smoking during pregnancy can result in low birth weight babies. It has been associated with infertility, miscarriages, tubal pregnancies, infant death (mortality) and poor health (morbidity) in childhood. Additionally, cigarette smoking may cause long-term learning disabilities. If you smoke, you should try to quit before getting pregnant and not smoke during the pregnancy. Secondary smoke may also harm a mother and her developing baby. It is a good idea to ask people to stop smoking around you during your pregnancy and after the baby is born. Extra calcium is necessary when you are pregnant and is found in your prenatal vitamin, in dairy products, green leafy vegetables and in calcium supplements.  D  A healthy diet according to your current weight and height, along with vitamins and mineral supplements should be discussed with your caregiver. Domestic abuse or violence should be made known to your doctor right away to get the situation corrected. Drink more water when you exercise to keep hydrated. Discomfort of your back and legs usually develops and progresses from the middle of the second trimester through to delivery of the baby. This is because of the enlarging baby and uterus, which may also affect your balance. Do not take illegal drugs. Illegal drugs can seriously harm the baby and you. Drink extra   fluids (water is best) throughout pregnancy to help your body keep up with the increases in your blood volume. Drink at least 6 to 8 glasses of water, fruit juice, or milk each day. A good way to know you are drinking enough fluid is when your urine looks almost like clear water or is very light  yellow.   E  Eat healthy to get the nutrients you and your unborn baby need. Your meals should include the five basic food groups. Exercise (30 minutes of light to moderate exercise a day) is important and encouraged during pregnancy, if there are no medical problems or problems with the pregnancy. Exercise that causes discomfort or dizziness should be stopped and reported to your caregiver. Emotions during pregnancy can change from being ecstatic to depression and should be understood by you, your partner and your family.  F  Fetal screening with ultrasound, amniocentesis and monitoring during pregnancy and labor is common and sometimes necessary. Take 400 micrograms of folic acid daily both before, when possible, and during the first few months of pregnancy to reduce the risk of birth defects of the brain and spine. All women who could possibly become pregnant should take a vitamin with folic acid, every day. It is also important to eat a healthy diet with fortified foods (enriched grain products, including cereals, rice, breads, and pastas) and foods with natural sources of folate (orange juice, green leafy vegetables, beans, peanuts, broccoli, asparagus, peas, and lentils). The father should be involved with all aspects of the pregnancy including, the prenatal care, childbirth classes, labor, delivery, and postpartum time. Fathers may also have emotional concerns about being a father, financial needs, and raising a family.  G  Genetic testing should be done appropriately. It is important to know your family and the father's history. If there have been problems with pregnancies or birth defects in your family, report these to your doctor. Also, genetic counselors can talk with you about the information you might need in making decisions about having a family. You can call a major medical center in your area for help in finding a board-certified genetic counselor. Genetic testing and counseling should be done  before pregnancy when possible, especially if there is a history of problems in the mother's or father's family. Certain ethnic backgrounds are more at risk for genetic defects.  H  Get familiar with the hospital where you will be having your baby. Get to know how long it takes to get there, the labor and delivery area, and the hospital procedures. Be sure your medical insurance is accepted there. Get your home ready for the baby including, clothes, the baby's room (when possible), furniture and car seat. Hand washing is important throughout the day, especially after handling raw meat and poultry, changing the baby's diaper or using the bathroom. This can help prevent the spread of many bacteria and viruses that cause infection. Your hair may become dry and thinner, but will return to normal a few weeks after the baby is born. Heartburn is a common problem that can be treated by taking antacids recommended by your caregiver, eating smaller meals 5 or 6 times a day, not drinking liquids when eating, drinking between meals and raising the head of your bed 2 to 3 inches.  I  Insurance to cover you, the baby, doctor and hospital should be reviewed so that you will be prepared to pay any costs not covered by your insurance plan. If you do not have medical insurance,   there are usually clinics and services available for you in your community. Take 30 milligrams of iron during your pregnancy as prescribed by your doctor to reduce the risk of low red blood cells (anemia) later in pregnancy. All women of childbearing age should eat a diet rich in iron.  J  There should be a joint effort for the mother, father and any other children to adapt to the pregnancy financially, emotionally, and psychologically during the pregnancy. Join a support group for moms-to-be. Or, join a class on parenting or childbirth. Have the family participate when possible.  K  Know your limits. Let your caregiver know if you experience any of the  following:   · Pain of any kind.  · Strong cramps.  · You develop a lot of weight in a short period of time (5 pounds in 3 to 5 days).  · Vaginal bleeding, leaking of amniotic fluid.  · Headache, vision problems.  · Dizziness, fainting, shortness of breath.  · Chest pain.  · Fever of 102° F (38.9° C) or higher.  · Gush of clear fluid from your vagina.  · Painful urination.  · Domestic violence.  · Irregular heartbeat (palpitations).  · Rapid beating of the heart (tachycardia).  · Constant feeling sick to your stomach (nauseous) and vomiting.  · Trouble walking, fluid retention (edema).  · Muscle weakness.  · If your baby has decreased activity.  · Persistent diarrhea.  · Abnormal vaginal discharge.  · Uterine contractions at 20-minute intervals.  · Back pain that travels down your leg.  L  Learn and practice that what you eat and drink should be in moderation and healthy for you and your baby. Legal drugs such as alcohol and caffeine are important issues for pregnant women. There is no safe amount of alcohol a woman can drink while pregnant. Fetal alcohol syndrome, a disorder characterized by growth retardation, facial abnormalities, and central nervous system dysfunction, is caused by a woman's use of alcohol during pregnancy. Caffeine, found in tea, coffee, soft drinks and chocolate, should also be limited. Be sure to read labels when trying to cut down on caffeine during pregnancy. More than 200 foods, beverages, and over-the-counter medications contain caffeine and have a high salt content! There are coffees and teas that do not contain caffeine.  M  Medical conditions such as diabetes, epilepsy, and high blood pressure should be treated and kept under control before pregnancy when possible, but especially during pregnancy. Ask your caregiver about any medications that may need to be changed or adjusted during pregnancy. If you are currently taking any medications, ask your caregiver if it is safe to take them  while you are pregnant or before getting pregnant when possible. Also, be sure to discuss any herbs or vitamins you are taking. They are medicines, too! Discuss with your doctor all medications, prescribed and over-the-counter, that you are taking. During your prenatal visit, discuss the medications your doctor may give you during labor and delivery.  N  Never be afraid to ask your doctor or caregiver questions about your health, the progress of the pregnancy, family problems, stressful situations, and recommendation for a pediatrician, if you do not have one. It is better to take all precautions and discuss any questions or concerns you may have during your office visits. It is a good idea to write down your questions before you visit the doctor.  O  Over-the-counter cough and cold remedies may contain alcohol or other ingredients that should   be avoided during pregnancy. Ask your caregiver about prescription, herbs or over-the-counter medications that you are taking or may consider taking while pregnant.   P  Physical activity during pregnancy can benefit both you and your baby by lessening discomfort and fatigue, providing a sense of well-being, and increasing the likelihood of early recovery after delivery. Light to moderate exercise during pregnancy strengthens the belly (abdominal) and back muscles. This helps improve posture. Practicing yoga, walking, swimming, and cycling on a stationary bicycle are usually safe exercises for pregnant women. Avoid scuba diving, exercise at high altitudes (over 3000 feet), skiing, horseback riding, contact sports, etc. Always check with your doctor before beginning any kind of exercise, especially during pregnancy and especially if you did not exercise before getting pregnant.  Q  Queasiness, stomach upset and morning sickness are common during pregnancy. Eating a couple of crackers or dry toast before getting out of bed. Foods that you normally love may make you feel sick to  your stomach. You may need to substitute other nutritious foods. Eating 5 or 6 small meals a day instead of 3 large ones may make you feel better. Do not drink with your meals, drink between meals. Questions that you have should be written down and asked during your prenatal visits.  R  Read about and make plans to baby-proof your home. There are important tips for making your home a safer environment for your baby. Review the tips and make your home safer for you and your baby. Read food labels regarding calories, salt and fat content in the food.  S  Saunas, hot tubs, and steam rooms should be avoided while you are pregnant. Excessive high heat may be harmful during your pregnancy. Your caregiver will screen and examine you for sexually transmitted diseases and genetic disorders during your prenatal visits. Learn the signs of labor. Sexual relations while pregnant is safe unless there is a medical or pregnancy problem and your caregiver advises against it.  T  Traveling long distances should be avoided especially in the third trimester of your pregnancy. If you do have to travel out of state, be sure to take a copy of your medical records and medical insurance plan with you. You should not travel long distances without seeing your doctor first. Most airlines will not allow you to travel after 36 weeks of pregnancy. Toxoplasmosis is an infection caused by a parasite that can seriously harm an unborn baby. Avoid eating undercooked meat and handling cat litter. Be sure to wear gloves when gardening. Tingling of the hands and fingers is not unusual and is due to fluid retention. This will go away after the baby is born.  U  Womb (uterus) size increases during the first trimester. Your kidneys will begin to function more efficiently. This may cause you to feel the need to urinate more often. You may also leak urine when sneezing, coughing or laughing. This is due to the growing uterus pressing against your bladder,  which lies directly in front of and slightly under the uterus during the first few months of pregnancy. If you experience burning along with frequency of urination or bloody urine, be sure to tell your doctor. The size of your uterus in the third trimester may cause a problem with your balance. It is advisable to maintain good posture and avoid wearing high heels during this time. An ultrasound of your baby may be necessary during your pregnancy and is safe for you and your baby.  V    Vaccinations are an important concern for pregnant women. Get needed vaccines before pregnancy. Center for Disease Control (www.cdc.gov) has clear guidelines for the use of vaccines during pregnancy. Review the list, be sure to discuss it with your doctor. Prenatal vitamins are helpful and healthy for you and the baby. Do not take extra vitamins except what is recommended. Taking too much of certain vitamins can cause overdose problems. Continuous vomiting should be reported to your caregiver. Varicose veins may appear especially if there is a family history of varicose veins. They should subside after the delivery of the baby. Support hose helps if there is leg discomfort.  W  Being overweight or underweight during pregnancy may cause problems. Try to get within 15 pounds of your ideal weight before pregnancy. Remember, pregnancy is not a time to be dieting! Do not stop eating or start skipping meals as your weight increases. Both you and your baby need the calories and nutrition you receive from a healthy diet. Be sure to consult with your doctor about your diet. There is a formula and diet plan available depending on whether you are overweight or underweight. Your caregiver or nutritionist can help and advise you if necessary.  X  Avoid X-rays. If you must have dental work or diagnostic tests, tell your dentist or physician that you are pregnant so that extra care can be taken. X-rays should only be taken when the risks of not taking  them outweigh the risk of taking them. If needed, only the minimum amount of radiation should be used. When X-rays are necessary, protective lead shields should be used to cover areas of the body that are not being X-rayed.  Y  Your baby loves you. Breastfeeding your baby creates a loving and very close bond between the two of you. Give your baby a healthy environment to live in while you are pregnant. Infants and children require constant care and guidance. Their health and safety should be carefully watched at all times. After the baby is born, rest or take a nap when the baby is sleeping.  Z  Get your ZZZs. Be sure to get plenty of rest. Resting on your side as often as possible, especially on your left side is advised. It provides the best circulation to your baby and helps reduce swelling. Try taking a nap for 30 to 45 minutes in the afternoon when possible. After the baby is born rest or take a nap when the baby is sleeping. Try elevating your feet for that amount of time when possible. It helps the circulation in your legs and helps reduce swelling.   Most information courtesy of the CDC.  Document Released: 01/23/2005 Document Revised: 04/17/2011 Document Reviewed: 10/07/2008  [employer]® Patient Information ©2014 [employer].

## 2013-02-11 NOTE — MAU Provider Note (Signed)
  History     CSN: [id number]  Arrival date and time: 02/11/13 1727   None     Chief Complaint  Patient presents with  . Possible Pregnancy   HPI This is a 28 y.o. female at 6.[redacted] weeks gestation by LMP who presents requesting pregnancy test and proof of pregnancy letter.  Denies any problems.  RN Note:    Patient states she has had 2 positive home pregnancy tests. Denies any pain, bleeding or discharge, nausea or vomiting.        OB History   Grav Para Term Preterm Abortions TAB SAB Ect Mult Living   5 4 1 3  0  0   3      Past Medical History  Diagnosis Date  . Anemia   . Preterm labor   . Infection     UTI  . Eczema   . Abnormal Pap smear   . Genital herpes     Past Surgical History  Procedure Laterality Date  . Tonsillectomy    . Wisdom tooth extraction      Family History  Problem Relation Age of Onset  . Other Neg Hx   . Hypertension Mother   . Hypertension Father   . Hypertension Maternal Grandmother   . Cancer Paternal Uncle     1 prostate, 1 colon    History  Substance Use Topics  . Smoking status: Never Smoker   . Smokeless tobacco: Never Used  . Alcohol Use: No    Allergies:  Allergies  Allergen Reactions  . Percocet [Oxycodone-Acetaminophen] Nausea And Vomiting    Prescriptions prior to admission  Medication Sig Dispense Refill  . valACYclovir (VALTREX) 500 MG tablet Take 500 mg by mouth 3 (three) times daily.         Review of Systems  Constitutional: Negative for fever, chills and malaise/fatigue.  Gastrointestinal: Negative for nausea, vomiting, abdominal pain, diarrhea and constipation.  Neurological: Negative for dizziness, weakness and headaches.   Physical Exam   Blood pressure 123/79, pulse 83, temperature 98.2 F (36.8 C), temperature source Oral, resp. rate 16, height 5' 1.5" (1.562 m), weight 93.169 kg (205 lb 6.4 oz), last menstrual period 12/31/2012, SpO2 100.00%.  Physical Exam  Constitutional: She is oriented  to person, place, and time. She appears well-developed and well-nourished. No distress.  Cardiovascular: Normal rate.   Respiratory: Effort normal.  Genitourinary:  Deferred per pt request, only wants letter  Musculoskeletal: Normal range of motion.  Neurological: She is alert and oriented to person, place, and time.  Skin: Skin is warm and dry.  Psychiatric: She has a normal mood and affect.    MAU Course  Procedures  MDM Results for orders placed during the hospital encounter of 02/11/13 (from the past 24 hour(s))  POCT PREGNANCY, URINE     Status: Abnormal   Collection Time    02/11/13  6:10 PM      Result Value Range   Preg Test, Ur POSITIVE (*) NEGATIVE     Assessment and Plan  A:  Pregnancy at 6625w0d       P:  Discharge home       Proof of pregnancy letter provided  [facility] LLCWILLIAMS,Tam Savoia 02/11/2013, 6:28 PM

## 2013-03-13 LAB — OB RESULTS CONSOLE HEPATITIS B SURFACE ANTIGEN: Hepatitis B Surface Ag: NEGATIVE

## 2013-03-13 LAB — OB RESULTS CONSOLE ANTIBODY SCREEN: Antibody Screen: NEGATIVE

## 2013-03-13 LAB — OB RESULTS CONSOLE RUBELLA ANTIBODY, IGM: Rubella: IMMUNE

## 2013-03-13 LAB — OB RESULTS CONSOLE GC/CHLAMYDIA
CHLAMYDIA, DNA PROBE: NEGATIVE
GC PROBE AMP, GENITAL: NEGATIVE

## 2013-03-13 LAB — OB RESULTS CONSOLE ABO/RH: RH Type: POSITIVE

## 2013-03-13 LAB — OB RESULTS CONSOLE HIV ANTIBODY (ROUTINE TESTING): HIV: REACTIVE

## 2013-03-13 LAB — OB RESULTS CONSOLE RPR: RPR: NONREACTIVE

## 2013-05-20 ENCOUNTER — Encounter (HOSPITAL_COMMUNITY): Payer: Self-pay

## 2013-05-20 ENCOUNTER — Inpatient Hospital Stay (HOSPITAL_COMMUNITY)
Admission: AD | Admit: 2013-05-20 | Discharge: 2013-05-20 | Disposition: A | Discharge status: Home / Self Care | Payer: Medicaid Other | Source: Ambulatory Visit | Attending: Obstetrics and Gynecology | Admitting: Obstetrics and Gynecology

## 2013-05-20 DIAGNOSIS — O210 Mild hyperemesis gravidarum: Secondary | ICD-10-CM | POA: Insufficient documentation

## 2013-05-20 DIAGNOSIS — A088 Other specified intestinal infections: Secondary | ICD-10-CM | POA: Insufficient documentation

## 2013-05-20 DIAGNOSIS — R109 Unspecified abdominal pain: Secondary | ICD-10-CM | POA: Insufficient documentation

## 2013-05-20 DIAGNOSIS — R197 Diarrhea, unspecified: Secondary | ICD-10-CM | POA: Insufficient documentation

## 2013-05-20 DIAGNOSIS — O99891 Other specified diseases and conditions complicating pregnancy: Secondary | ICD-10-CM | POA: Insufficient documentation

## 2013-05-20 DIAGNOSIS — O9989 Other specified diseases and conditions complicating pregnancy, childbirth and the puerperium: Principal | ICD-10-CM

## 2013-05-20 DIAGNOSIS — A084 Viral intestinal infection, unspecified: Secondary | ICD-10-CM

## 2013-05-20 LAB — URINALYSIS, ROUTINE W REFLEX MICROSCOPIC
Bilirubin Urine: NEGATIVE
Glucose, UA: NEGATIVE mg/dL
Hgb urine dipstick: NEGATIVE
Ketones, ur: NEGATIVE mg/dL
NITRITE: NEGATIVE
PH: 6 (ref 5.0–8.0)
Protein, ur: NEGATIVE mg/dL
Urobilinogen, UA: 1 mg/dL (ref 0.0–1.0)

## 2013-05-20 LAB — URINE MICROSCOPIC-ADD ON

## 2013-05-20 MED ORDER — ONDANSETRON HCL 4 MG/2ML IJ SOLN
4.0000 mg | Freq: Once | INTRAMUSCULAR | Status: AC
  Administered 2013-05-20: 4 mg via INTRAVENOUS
  Filled 2013-05-20: qty 2

## 2013-05-20 MED ORDER — PROMETHAZINE HCL 25 MG PO TABS: 12.5000 mg | ORAL_TABLET | Freq: Four times a day (QID) | ORAL | Status: DC | PRN

## 2013-05-20 MED ORDER — LACTATED RINGERS IV BOLUS (SEPSIS)
1000.0000 mL | Freq: Once | INTRAVENOUS | Status: AC
  Administered 2013-05-20: 1000 mL via INTRAVENOUS

## 2013-05-20 NOTE — MAU Provider Note (Signed)
History     CSN: [id number]  Arrival date and time: 05/20/13 1131   None     Chief Complaint  Patient presents with  . Emesis  . Diarrhea  . Abdominal Pain   HPI  [name] is a 28 y.o. female [phone] at 3058w1d who presents with N/V/D; symptoms started this morning at 0500. The patient has vomited 7 days, with diarrhea each time she vomits. Her son had the stomach bug yesterday.   OB History   Grav Para Term Preterm Abortions TAB SAB Ect Mult Living   5 4 1 3  0  0   3      Past Medical History  Diagnosis Date  . Anemia   . Preterm labor   . Infection     UTI  . Eczema   . Abnormal Pap smear   . Genital herpes     Past Surgical History  Procedure Laterality Date  . Tonsillectomy    . Wisdom tooth extraction      Family History  Problem Relation Age of Onset  . Other Neg Hx   . Hypertension Mother   . Hypertension Father   . Hypertension Maternal Grandmother   . Cancer Paternal Uncle     1 prostate, 1 colon    History  Substance Use Topics  . Smoking status: Never Smoker   . Smokeless tobacco: Never Used  . Alcohol Use: No    Allergies:  Allergies  Allergen Reactions  . Percocet [Oxycodone-Acetaminophen] Nausea And Vomiting    Prescriptions prior to admission  Medication Sig Dispense Refill  . Prenatal Vit-Fe Fumarate-FA (PRENATAL MULTIVITAMIN) TABS tablet Take 1 tablet by mouth daily at 12 noon.      . valACYclovir (VALTREX) 500 MG tablet Take 500 mg by mouth 3 (three) times daily.        Results for orders placed during the hospital encounter of 05/20/13 (from the past 48 hour(s))  URINALYSIS, ROUTINE W REFLEX MICROSCOPIC     Status: Abnormal   Collection Time    05/20/13 11:50 AM      Result Value Ref Range   Color, Urine YELLOW  YELLOW   APPearance HAZY (*) CLEAR   Specific Gravity, Urine >1.030 (*) 1.005 - 1.030   pH 6.0  5.0 - 8.0   Glucose, UA NEGATIVE  NEGATIVE mg/dL   Hgb urine dipstick NEGATIVE  NEGATIVE   Bilirubin  Urine NEGATIVE  NEGATIVE   Ketones, ur NEGATIVE  NEGATIVE mg/dL   Protein, ur NEGATIVE  NEGATIVE mg/dL   Urobilinogen, UA 1.0  0.0 - 1.0 mg/dL   Nitrite NEGATIVE  NEGATIVE   Leukocytes, UA MODERATE (*) NEGATIVE  URINE MICROSCOPIC-ADD ON     Status: Abnormal   Collection Time    05/20/13 11:50 AM      Result Value Ref Range   Squamous Epithelial / LPF MANY (*) RARE   WBC, UA 11-20  <3 WBC/hpf   RBC / HPF 3-6  <3 RBC/hpf   Bacteria, UA MANY (*) RARE    Review of Systems  Constitutional: Negative for fever and chills.  Gastrointestinal: Positive for nausea, vomiting, abdominal pain (+ abdominal cramping ) and diarrhea.  Genitourinary: Negative for dysuria, urgency, frequency and hematuria.   Physical Exam   Blood pressure 109/65, pulse 100, temperature 99 F (37.2 C), temperature source Oral, resp. rate 16, height 5' (1.524 m), weight 96.163 kg (212 lb), last menstrual period 12/31/2012, SpO2 99.00%.  Physical Exam  Constitutional: She  is oriented to person, place, and time. She appears well-developed and well-nourished. No distress.  HENT:  Head: Normocephalic.  Eyes: Pupils are equal, round, and reactive to light.  Neck: Neck supple.  Respiratory: Effort normal.  Musculoskeletal: Normal range of motion.  Neurological: She is alert and oriented to person, place, and time.  Skin: Skin is warm. She is not diaphoretic.  Psychiatric: Her behavior is normal.    MAU Course  Procedures None  MDM + fht  LR bolus times 1 Liter Zofran 4 mg IVP Consulted with Dr. Dareen PianoAnderson; will recommend something over the counter for patient to take for diarrhea if needed.   Assessment and Plan   A:  1. Viral gastroenteritis    P:  Discharge home in stable condition RX: Phenergan Ok to take imodium over the counter as directed, as needed Follow up with Dr. Dareen PianoAnderson as scheduled   Iona HansenJennifer Irene Azyah Flett, NP 05/20/2013, 12:18 PM

## 2013-05-20 NOTE — MAU Note (Signed)
Patient states she woke up this am with severe diarrhea and vomiting. Has been unable to keep anything down. Has some abdominal pain with the vomiting. Denies bleeding or discharge.

## 2013-08-14 ENCOUNTER — Inpatient Hospital Stay (HOSPITAL_COMMUNITY)
Admission: AD | Admit: 2013-08-14 | Discharge: 2013-08-14 | Disposition: A | Discharge status: Home / Self Care | Payer: No Typology Code available for payment source | Source: Ambulatory Visit | Attending: Obstetrics and Gynecology | Admitting: Obstetrics and Gynecology

## 2013-08-14 ENCOUNTER — Inpatient Hospital Stay (HOSPITAL_COMMUNITY): Payer: No Typology Code available for payment source

## 2013-08-14 ENCOUNTER — Encounter (HOSPITAL_COMMUNITY): Payer: Self-pay | Admitting: *Deleted

## 2013-08-14 DIAGNOSIS — O9989 Other specified diseases and conditions complicating pregnancy, childbirth and the puerperium: Principal | ICD-10-CM

## 2013-08-14 DIAGNOSIS — N760 Acute vaginitis: Secondary | ICD-10-CM | POA: Diagnosis not present

## 2013-08-14 DIAGNOSIS — Y92410 Unspecified street and highway as the place of occurrence of the external cause: Secondary | ICD-10-CM | POA: Insufficient documentation

## 2013-08-14 DIAGNOSIS — B9689 Other specified bacterial agents as the cause of diseases classified elsewhere: Secondary | ICD-10-CM | POA: Diagnosis not present

## 2013-08-14 DIAGNOSIS — O99891 Other specified diseases and conditions complicating pregnancy: Secondary | ICD-10-CM

## 2013-08-14 DIAGNOSIS — A499 Bacterial infection, unspecified: Secondary | ICD-10-CM

## 2013-08-14 DIAGNOSIS — O2390 Unspecified genitourinary tract infection in pregnancy, unspecified trimester: Secondary | ICD-10-CM | POA: Insufficient documentation

## 2013-08-14 LAB — WET PREP, GENITAL
Trich, Wet Prep: NONE SEEN
Yeast Wet Prep HPF POC: NONE SEEN

## 2013-08-14 LAB — POCT FERN TEST: POCT Fern Test: NEGATIVE

## 2013-08-14 MED ORDER — METRONIDAZOLE 500 MG PO TABS: 500.0000 mg | ORAL_TABLET | Freq: Two times a day (BID) | ORAL | Status: DC
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
## 2013-08-14 NOTE — Discharge Instructions (Signed)
°Bacterial Vaginosis °Bacterial vaginosis is a vaginal infection that occurs when the normal balance of bacteria in the vagina is disrupted. It results from an overgrowth of certain bacteria. This is the most common vaginal infection in women of childbearing age. Treatment is important to prevent complications, especially in pregnant women, as it can cause a premature delivery. °CAUSES  °Bacterial vaginosis is caused by an increase in harmful bacteria that are normally present in smaller amounts in the vagina. Several different kinds of bacteria can cause bacterial vaginosis. However, the reason that the condition develops is not fully understood. °RISK FACTORS °Certain activities or behaviors can put you at an increased risk of developing bacterial vaginosis, including: °· Having a new sex partner or multiple sex partners. °· Douching. °· Using an intrauterine device (IUD) for contraception. °Women do not get bacterial vaginosis from toilet seats, bedding, swimming pools, or contact with objects around them. °SIGNS AND SYMPTOMS  °Some women with bacterial vaginosis have no signs or symptoms. Common symptoms include: °· Grey vaginal discharge. °· A fishlike odor with discharge, especially after sexual intercourse. °· Itching or burning of the vagina and vulva. °· Burning or pain with urination. °DIAGNOSIS  °Your health care provider will take a medical history and examine the vagina for signs of bacterial vaginosis. A sample of vaginal fluid may be taken. Your health care provider will look at this sample under a microscope to check for bacteria and abnormal cells. A vaginal pH test may also be done.  °TREATMENT  °Bacterial vaginosis may be treated with antibiotic medicines. These may be given in the form of a pill or a vaginal cream. A second round of antibiotics may be prescribed if the condition comes back after treatment.  °HOME CARE INSTRUCTIONS  °· Only take over-the-counter or prescription medicines as  directed by your health care provider. °· If antibiotic medicine was prescribed, take it as directed. Make sure you finish it even if you start to feel better. °· Do not have sex until treatment is completed. °· Tell all sexual partners that you have a vaginal infection. They should see their health care provider and be treated if they have problems, such as a mild rash or itching. °· Practice safe sex by using condoms and only having one sex partner. °SEEK MEDICAL CARE IF:  °· Your symptoms are not improving after 3 days of treatment. °· You have increased discharge or pain. °· You have a fever. °MAKE SURE YOU:  °· Understand these instructions. °· Will watch your condition. °· Will get help right away if you are not doing well or get worse. °FOR MORE INFORMATION  °Centers for Disease Control and Prevention, Division of STD Prevention: www.cdc.gov/std °American Sexual Health Association (ASHA): www.ashastd.org  °Document Released: 01/23/2005 Document Revised: 11/13/2012 Document Reviewed: 09/04/2012 °ExitCare® Patient Information ©2015 [employer]. This information is not intended to replace advice given to you by your health care provider. Make sure you discuss any questions you have with your health care provider. °Third Trimester of Pregnancy °The third trimester is from week 29 through week 42, months 7 through 9. The third trimester is a time when the fetus is growing rapidly. At the end of the ninth month, the fetus is about 20 inches in length and weighs 6-10 pounds.  °BODY CHANGES °Your body goes through many changes during pregnancy. The changes vary from woman to woman.  °· Your weight will continue to increase. You can expect to gain 25-35 pounds (11-16 kg) by   kg) by the end of the pregnancy.  You may begin to get stretch marks on your hips, abdomen, and breasts.  You may urinate more often because the fetus is moving lower into your pelvis and pressing on your bladder.  You may develop or continue  to have heartburn as a result of your pregnancy.  You may develop constipation because certain hormones are causing the muscles that push waste through your intestines to slow down.  You may develop hemorrhoids or swollen, bulging veins (varicose veins).  You may have pelvic pain because of the weight gain and pregnancy hormones relaxing your joints between the bones in your pelvis. Backaches may result from overexertion of the muscles supporting your posture.  You may have changes in your hair. These can include thickening of your hair, rapid growth, and changes in texture. Some women also have hair loss during or after pregnancy, or hair that feels dry or thin. Your hair will most likely return to normal after your baby is born.  Your breasts will continue to grow and be tender. A yellow discharge may leak from your breasts called colostrum.  Your belly button may stick out.  You may feel short of breath because of your expanding uterus.  You may notice the fetus "dropping," or moving lower in your abdomen.  You may have a bloody mucus discharge. This usually occurs a few days to a week before labor begins.  Your cervix becomes thin and soft (effaced) near your due date. WHAT TO EXPECT AT YOUR PRENATAL EXAMS  You will have prenatal exams every 2 weeks until week 36. Then, you will have weekly prenatal exams. During a routine prenatal visit:  You will be weighed to make sure you and the fetus are growing normally.  Your blood pressure is taken.  Your abdomen will be measured to track your baby's growth.  The fetal heartbeat will be listened to.  Any test results from the previous visit will be discussed.  You may have a cervical check near your due date to see if you have effaced. At around 36 weeks, your caregiver will check your cervix. At the same time, your caregiver will also perform a test on the secretions of the vaginal tissue. This test is to determine if a type of  bacteria, Group B streptococcus, is present. Your caregiver will explain this further. Your caregiver may ask you:  What your birth plan is.  How you are feeling.  If you are feeling the baby move.  If you have had any abnormal symptoms, such as leaking fluid, bleeding, severe headaches, or abdominal cramping.  If you have any questions. Other tests or screenings that may be performed during your third trimester include:  Blood tests that check for low iron levels (anemia).  Fetal testing to check the health, activity level, and growth of the fetus. Testing is done if you have certain medical conditions or if there are problems during the pregnancy. FALSE LABOR You may feel small, irregular contractions that eventually go away. These are called Braxton Hicks contractions, or false labor. Contractions may last for hours, days, or even weeks before true labor sets in. If contractions come at regular intervals, intensify, or become painful, it is best to be seen by your caregiver.  SIGNS OF LABOR   Menstrual-like cramps.  Contractions that are 5 minutes apart or less.  Contractions that start on the top of the uterus and spread down to the lower abdomen and back.  of increased pelvic pressure or back pain. °· A watery or bloody mucus discharge that comes from the vagina. °If you have any of these signs before the 37th week of pregnancy, call your caregiver right away. You need to go to the hospital to get checked immediately. °HOME CARE INSTRUCTIONS  °· Avoid all smoking, herbs, alcohol, and unprescribed drugs. These chemicals affect the formation and growth of the baby. °· Follow your caregiver's instructions regarding medicine use. There are medicines that are either safe or unsafe to take during pregnancy. °· Exercise only as directed by your caregiver. Experiencing uterine cramps is a good sign to stop exercising. °· Continue to eat regular, healthy meals. °· Wear a good support bra for  breast tenderness. °· Do not use hot tubs, steam rooms, or saunas. °· Wear your seat belt at all times when driving. °· Avoid raw meat, uncooked cheese, cat litter boxes, and soil used by cats. These carry germs that can cause birth defects in the baby. °· Take your prenatal vitamins. °· Try taking a stool softener (if your caregiver approves) if you develop constipation. Eat more high-fiber foods, such as fresh vegetables or fruit and whole grains. Drink plenty of fluids to keep your urine clear or pale yellow. °· Take warm sitz baths to soothe any pain or discomfort caused by hemorrhoids. Use hemorrhoid cream if your caregiver approves. °· If you develop varicose veins, wear support hose. Elevate your feet for 15 minutes, 3-4 times a day. Limit salt in your diet. °· Avoid heavy lifting, wear low heal shoes, and practice good posture. °· Rest a lot with your legs elevated if you have leg cramps or low back pain. °· Visit your dentist if you have not gone during your pregnancy. Use a soft toothbrush to brush your teeth and be gentle when you floss. °· A sexual relationship may be continued unless your caregiver directs you otherwise. °· Do not travel far distances unless it is absolutely necessary and only with the approval of your caregiver. °· Take prenatal classes to understand, practice, and ask questions about the labor and delivery. °· Make a trial run to the hospital. °· Pack your hospital bag. °· Prepare the baby's nursery. °· Continue to go to all your prenatal visits as directed by your caregiver. °SEEK MEDICAL CARE IF: °· You are unsure if you are in labor or if your water has broken. °· You have dizziness. °· You have mild pelvic cramps, pelvic pressure, or nagging pain in your abdominal area. °· You have persistent nausea, vomiting, or diarrhea. °· You have a bad smelling vaginal discharge. °· You have pain with urination. °SEEK IMMEDIATE MEDICAL CARE IF:  °· You have a fever. °· You are leaking fluid  from your vagina. °· You have spotting or bleeding from your vagina. °· You have severe abdominal cramping or pain. °· You have rapid weight loss or gain. °· You have shortness of breath with chest pain. °· You notice sudden or extreme swelling of your face, hands, ankles, feet, or legs. °· You have not felt your baby move in over an hour. °· You have severe headaches that do not go away with medicine. °· You have vision changes. °Document Released: 01/17/2001 Document Revised: 01/28/2013 Document Reviewed: 03/26/2012 °ExitCare® Patient Information ©2015 [employer]. This information is not intended to replace advice given to you by your health care provider. Make sure you discuss any questions you have with your health care provider. ° °
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
## 2013-09-17 ENCOUNTER — Encounter (HOSPITAL_COMMUNITY): Payer: Self-pay | Admitting: *Deleted

## 2013-09-17 ENCOUNTER — Emergency Department (HOSPITAL_COMMUNITY)
Admission: EM | Admit: 2013-09-17 | Discharge: 2013-09-17 | Disposition: A | Discharge status: Home / Self Care | Payer: Medicaid Other | Source: Home / Self Care

## 2013-09-17 ENCOUNTER — Encounter (HOSPITAL_COMMUNITY): Payer: Self-pay | Admitting: Emergency Medicine

## 2013-09-17 ENCOUNTER — Inpatient Hospital Stay (HOSPITAL_COMMUNITY)
Admission: AD | Admit: 2013-09-17 | Discharge: 2013-09-17 | Disposition: A | Discharge status: Home / Self Care | Payer: Medicaid Other | Source: Ambulatory Visit | Attending: Obstetrics and Gynecology | Admitting: Obstetrics and Gynecology

## 2013-09-17 DIAGNOSIS — T63461A Toxic effect of venom of wasps, accidental (unintentional), initial encounter: Secondary | ICD-10-CM | POA: Diagnosis not present

## 2013-09-17 DIAGNOSIS — O99891 Other specified diseases and conditions complicating pregnancy: Secondary | ICD-10-CM | POA: Diagnosis present

## 2013-09-17 DIAGNOSIS — T6391XA Toxic effect of contact with unspecified venomous animal, accidental (unintentional), initial encounter: Secondary | ICD-10-CM | POA: Diagnosis not present

## 2013-09-17 DIAGNOSIS — T63441S Toxic effect of venom of bees, accidental (unintentional), sequela: Secondary | ICD-10-CM

## 2013-09-17 DIAGNOSIS — T6591XS Toxic effect of unspecified substance, accidental (unintentional), sequela: Secondary | ICD-10-CM

## 2013-09-17 DIAGNOSIS — T63481A Toxic effect of venom of other arthropod, accidental (unintentional), initial encounter: Secondary | ICD-10-CM

## 2013-09-17 DIAGNOSIS — O9989 Other specified diseases and conditions complicating pregnancy, childbirth and the puerperium: Principal | ICD-10-CM

## 2013-09-17 LAB — URINALYSIS, ROUTINE W REFLEX MICROSCOPIC
Bilirubin Urine: NEGATIVE
GLUCOSE, UA: NEGATIVE mg/dL
Hgb urine dipstick: NEGATIVE
Ketones, ur: NEGATIVE mg/dL
Nitrite: NEGATIVE
PROTEIN: NEGATIVE mg/dL
Specific Gravity, Urine: 1.02 (ref 1.005–1.030)
UROBILINOGEN UA: 2 mg/dL — AB (ref 0.0–1.0)
pH: 6 (ref 5.0–8.0)

## 2013-09-17 LAB — URINE MICROSCOPIC-ADD ON

## 2013-09-17 LAB — OB RESULTS CONSOLE GBS: STREP GROUP B AG: POSITIVE

## 2013-09-17 MED ORDER — ACETAMINOPHEN 500 MG PO TABS
500.0000 mg | ORAL_TABLET | ORAL | Status: AC
  Administered 2013-09-17: 500 mg via ORAL
  Filled 2013-09-17: qty 1

## 2013-09-17 NOTE — ED Notes (Signed)
Pt c/o yellow jacket sting to bottom of left foot Sx also include swelling, pain and numbness  Denies SOB, dyspnea, rash Pt is 35 weeks preg Alert w/no signs of acute distress.

## 2013-09-17 NOTE — MAU Note (Signed)
Pt reports she had a bee sting today on the bottom on the bottom of her left foot at 6pm, states her foot and leg feels numb and the bottom of her foot hurts.

## 2013-09-17 NOTE — ED Notes (Signed)
Pt declined to be tx to ER Reports she had to p/u her 436 y/o daughter

## 2013-09-17 NOTE — ED Provider Notes (Signed)
CSN: [id number]     Arrival date & time 09/17/13  1817 History   None    Chief Complaint  Patient presents with  . Insect Bite   (Consider location/radiation/quality/duration/timing/severity/associated sxs/prior Treatment) Patient is a 28 y.o. female presenting with neurologic complaint. The history is provided by the patient.  Neurologic Problem This is a new problem. The current episode started 1 to 2 hours ago. The problem has been gradually worsening. Pertinent negatives include no chest pain, no abdominal pain and no shortness of breath. Associated symptoms comments: Reports numbness to left leg, and pain , unable to drive, not weakness,, no rash from yellow jacket sting to foot this eve. No systemic sx at all..    Past Medical History  Diagnosis Date  . Anemia   . Preterm labor   . Infection     UTI  . Eczema   . Abnormal Pap smear   . Genital herpes    Past Surgical History  Procedure Laterality Date  . Tonsillectomy    . Wisdom tooth extraction     Family History  Problem Relation Age of Onset  . Other Neg Hx   . Hypertension Mother   . Hypertension Father   . Hypertension Maternal Grandmother   . Cancer Paternal Uncle     1 prostate, 1 colon   History  Substance Use Topics  . Smoking status: Never Smoker   . Smokeless tobacco: Never Used  . Alcohol Use: No   OB History   Grav Para Term Preterm Abortions TAB SAB Ect Mult Living   5 4 1 3  0  0   3     Review of Systems  Constitutional: Negative.   HENT: Negative for trouble swallowing.   Respiratory: Negative for cough and shortness of breath.   Cardiovascular: Negative for chest pain.  Gastrointestinal: Negative for abdominal pain.  Musculoskeletal: Negative for back pain, gait problem, joint swelling and myalgias.  Skin: Negative for rash and wound.  Neurological: Positive for numbness. Negative for weakness.    Allergies  Percocet  Home Medications   Prior to Admission medications    Medication Sig Start Date End Date Taking? Authorizing Provider  Prenatal Vit-Fe Fumarate-FA (PRENATAL MULTIVITAMIN) TABS tablet Take 1 tablet by mouth daily at 12 noon.   Yes Historical Provider, MD  metroNIDAZOLE (FLAGYL) 500 MG tablet Take 1 tablet (500 mg total) by mouth 2 (two) times daily. 08/14/13   Heather Alger Memosonovan Hogan, CNM  valACYclovir (VALTREX) 500 MG tablet Take 500 mg by mouth 3 (three) times daily.     Historical Provider, MD   BP 129/98  Pulse 104  Temp(Src) 98.1 F (36.7 C) (Oral)  Resp 16  SpO2 98%  LMP 12/31/2012 Physical Exam  Nursing note and vitals reviewed. Constitutional: She is oriented to person, place, and time. She appears well-developed and well-nourished. She appears distressed.  HENT:  Mouth/Throat: Oropharynx is clear and moist.  Neck: Normal range of motion. Neck supple.  Pulmonary/Chest: Breath sounds normal.  Abdominal:  35 wk gravid.  Musculoskeletal: She exhibits tenderness.  Ambulatory, without diff, pulses 2+, full rom, no edema. Subjective decr sensation to leg.  Neurological: She is alert and oriented to person, place, and time.  Skin: Skin is warm and dry. No rash noted. No erythema.    ED Course  Procedures (including critical care time) Labs Review Labs Reviewed - No data to display  Imaging Review No results found.   MDM   1. Bee sting reaction,  accidental or unintentional, sequela        Linna Hoff, MD 09/17/13 [phone]

## 2013-09-17 NOTE — Discharge Instructions (Signed)

## 2013-09-17 NOTE — Discharge Instructions (Signed)
Go to er or [facility] if further problems.

## 2013-09-17 NOTE — MAU Provider Note (Signed)
History     CSN: [id number]  Arrival date and time: 09/17/13 [id number]   First Provider Initiated Contact with Patient 09/17/13 2015      Chief Complaint  Patient presents with   Insect Bite   HPI Tina Lambert 28 y.o. [id number] @[redacted]w[redacted]d  presents to MAU complaining of a yellow jacket sting on the bottom of her left foot that occurred at 6pm approximately 2 hours ago.  She has had extreme pain since that time, unresponsive to tobacco application and ice.  She also complains she cannot feel her foot or put pressure on it.  She already went to urgent care for evaluation.   She reports good fetal movement, no contractions, no bleeding or LOF.  She also had an office visit with OB today and was told she is already 2cm dilated and 50%.  She has a history of preterm births.  Allergies review with pt.  Although percocet is listed as an allergy, she experiences nausea only with this medication.  She has previously used tylenol through all 5 of her pregnancies without issue.   OB History   Grav Para Term Preterm Abortions TAB SAB Ect Mult Living   5 4 1 3  0  0   3      Past Medical History  Diagnosis Date   Anemia    Preterm labor    Infection     UTI   Eczema    Abnormal Pap smear    Genital herpes     Past Surgical History  Procedure Laterality Date   Tonsillectomy     Wisdom tooth extraction      Family History  Problem Relation Age of Onset   Other Neg Hx    Hypertension Mother    Hypertension Father    Hypertension Maternal Grandmother    Cancer Paternal Uncle     1 prostate, 1 colon    History  Substance Use Topics   Smoking status: Never Smoker    Smokeless tobacco: Never Used   Alcohol Use: No    Allergies:  Allergies  Allergen Reactions   Percocet [Oxycodone-Acetaminophen] Nausea And Vomiting    Prescriptions prior to admission  Medication Sig Dispense Refill   metroNIDAZOLE (FLAGYL) 500 MG tablet Take 1 tablet (500 mg total) by mouth 2  (two) times daily.  14 tablet  0   Prenatal Vit-Fe Fumarate-FA (PRENATAL MULTIVITAMIN) TABS tablet Take 1 tablet by mouth daily at 12 noon.       valACYclovir (VALTREX) 500 MG tablet Take 500 mg by mouth 3 (three) times daily.         Review of Systems  Constitutional: Negative for fever and chills.  Eyes: Negative for blurred vision.  Respiratory: Negative for cough and shortness of breath.   Cardiovascular: Negative for chest pain and palpitations.  Gastrointestinal: Positive for heartburn. Negative for nausea and vomiting.  Genitourinary: Positive for frequency. Negative for dysuria, urgency and hematuria.  Skin: Negative for rash.  Neurological: Negative for dizziness, weakness and headaches.   Physical Exam   Blood pressure 129/91, pulse 83, temperature 99 F (37.2 C), temperature source Oral, resp. rate 18, height 5\' 1"  (1.549 m), weight 96.616 kg (213 lb), last menstrual period 12/31/2012, SpO2 100.00%.  Physical Exam  Constitutional: She is oriented to person, place, and time. She appears well-developed and well-nourished. No distress.  HENT:  Head: Normocephalic and atraumatic.  Eyes: EOM are normal.  Neck: Normal range of motion.  Cardiovascular: Normal rate, regular rhythm and  normal heart sounds.   Respiratory: Effort normal and breath sounds normal.  GI: Soft.  Musculoskeletal: Normal range of motion.  Neurological: She is alert and oriented to person, place, and time.  Skin: Skin is warm and dry.   NST: Baseline: 145 Variability: moderate Accels: 15x15 Decels: none  Toco: irritability/irregular ctx pattern  MAU Course  Procedures None  MDM Discussed with Dr. Claiborne Billings.  Okay to go home and follow up as scheduled  Assessment and Plan  Assessment: Insect sting in pregnancy  Plan: Discharge to home.   Benadryl 25mg  prn with sedation precautions Tylenol prn pain not to exceed 3 grams per day Elevate left foot Apply ice to foot Follow up with Dr.  Claiborne Billings as scheduled Return to MAU for emergency  Bertram Denver 09/17/2013, 8:24 PM

## 2013-09-25 ENCOUNTER — Observation Stay (HOSPITAL_COMMUNITY)
Admission: AD | Admit: 2013-09-25 | Discharge: 2013-09-26 | Disposition: A | Discharge status: Home / Self Care | Payer: Medicaid Other | Source: Ambulatory Visit | Attending: Obstetrics | Admitting: Obstetrics

## 2013-09-25 ENCOUNTER — Encounter (HOSPITAL_COMMUNITY): Payer: Self-pay | Admitting: Pediatrics

## 2013-09-25 DIAGNOSIS — Z8751 Personal history of pre-term labor: Secondary | ICD-10-CM | POA: Diagnosis not present

## 2013-09-25 DIAGNOSIS — Z87440 Personal history of urinary (tract) infections: Secondary | ICD-10-CM | POA: Insufficient documentation

## 2013-09-25 DIAGNOSIS — O99019 Anemia complicating pregnancy, unspecified trimester: Secondary | ICD-10-CM | POA: Insufficient documentation

## 2013-09-25 DIAGNOSIS — R109 Unspecified abdominal pain: Secondary | ICD-10-CM | POA: Insufficient documentation

## 2013-09-25 DIAGNOSIS — O9989 Other specified diseases and conditions complicating pregnancy, childbirth and the puerperium: Secondary | ICD-10-CM | POA: Diagnosis not present

## 2013-09-25 DIAGNOSIS — Z872 Personal history of diseases of the skin and subcutaneous tissue: Secondary | ICD-10-CM | POA: Insufficient documentation

## 2013-09-25 DIAGNOSIS — Z8619 Personal history of other infectious and parasitic diseases: Secondary | ICD-10-CM | POA: Diagnosis not present

## 2013-09-25 DIAGNOSIS — O368990 Maternal care for other specified fetal problems, unspecified trimester, not applicable or unspecified: Secondary | ICD-10-CM | POA: Insufficient documentation

## 2013-09-25 DIAGNOSIS — D649 Anemia, unspecified: Secondary | ICD-10-CM | POA: Insufficient documentation

## 2013-09-25 DIAGNOSIS — O368390 Maternal care for abnormalities of the fetal heart rate or rhythm, unspecified trimester, not applicable or unspecified: Secondary | ICD-10-CM | POA: Diagnosis present

## 2013-09-25 LAB — URINALYSIS, ROUTINE W REFLEX MICROSCOPIC
Bilirubin Urine: NEGATIVE
Glucose, UA: NEGATIVE mg/dL
Hgb urine dipstick: NEGATIVE
Ketones, ur: 40 mg/dL — AB
NITRITE: NEGATIVE
Protein, ur: NEGATIVE mg/dL
Specific Gravity, Urine: 1.015 (ref 1.005–1.030)
UROBILINOGEN UA: 1 mg/dL (ref 0.0–1.0)
pH: 7.5 (ref 5.0–8.0)

## 2013-09-25 LAB — CBC
HCT: 31.5 % — ABNORMAL LOW (ref 36.0–46.0)
Hemoglobin: 10.3 g/dL — ABNORMAL LOW (ref 12.0–15.0)
MCH: 25.2 pg — AB (ref 26.0–34.0)
MCHC: 32.7 g/dL (ref 30.0–36.0)
MCV: 77 fL — AB (ref 78.0–100.0)
PLATELETS: 242 10*3/uL (ref 150–400)
RBC: 4.09 MIL/uL (ref 3.87–5.11)
RDW: 13.2 % (ref 11.5–15.5)
WBC: 4.8 10*3/uL (ref 4.0–10.5)

## 2013-09-25 LAB — URINE MICROSCOPIC-ADD ON

## 2013-09-25 MED ORDER — SODIUM CHLORIDE 0.9 % IV BOLUS (SEPSIS)
1000.0000 mL | Freq: Once | INTRAVENOUS | Status: AC
  Administered 2013-09-25: 1000 mL via INTRAVENOUS

## 2013-09-25 MED ORDER — ACETAMINOPHEN 325 MG PO TABS
650.0000 mg | ORAL_TABLET | ORAL | Status: DC | PRN
Start: 2013-09-25 — End: 2013-09-26
  Administered 2013-09-25: 650 mg via ORAL
  Filled 2013-09-25: qty 2

## 2013-09-25 MED ORDER — FAMOTIDINE 20 MG PO TABS: 20.0000 mg | ORAL_TABLET | Freq: Two times a day (BID) | ORAL | Status: DC

## 2013-09-25 MED ORDER — SODIUM CHLORIDE 0.9 % IV SOLN
INTRAVENOUS | Status: DC
  Administered 2013-09-25: 22:00:00 via INTRAVENOUS

## 2013-09-25 NOTE — Discharge Summary (Signed)
Antepartum Discharge Summary Reason for Admission: observation/evaluation for extended fetal monitoring Prenatal Procedures: NST   Hemoglobin  Date Value Ref Range Status  09/25/2013 10.3* 12.0 - 15.0 g/dL Final     HCT  Date Value Ref Range Status  09/25/2013 31.5* 36.0 - 46.0 % Final    Physical Exam:  General: alert and cooperative CTAB RRR SVE: 3/50/ballotable   Tina Lambert was observed for 6 hours on antepartum presentation to MAU.  Fetal status was reassuring. Abdominal pain improved with IVF bolus.  SVE was unchanged and there were no signs or symptoms of PTL.    Discharge Diagnoses: extended fetal monitoring  Discharge Information: Date: 09/25/2013 Activity: unrestricted Diet: routine Medications: pepcid Condition: stable Instructions: refer to practice specific booklet Discharge to: home Follow-up Information   Follow up with Marlow BaarsLARK, Sohan Potvin, MD In 1 week. (Keep your appointment scheduled next Wednesday)    Specialty:  Obstetrics   Contact information:   [address] [place] [address] [phone]        Marlow BaarsCLARK, Karyna Bessler 09/25/2013, 11:45 PM

## 2013-09-25 NOTE — MAU Note (Signed)
Pt. States she has been having lower abdominal pain for 2 weeks, the Dr. told her she was 2 cm.  The pain goes from her back to front.  [facility]

## 2013-09-25 NOTE — Progress Notes (Signed)
Deceleration to 90 bpm lasting for 60sec noted. Dr Kemper Durielarke made aware.

## 2013-09-25 NOTE — MAU Provider Note (Signed)
  History     CSN: [id number]  Arrival date and time: 09/25/13 1549   First Provider Initiated Contact with Patient 09/25/13 1753      Chief Complaint  Patient presents with  . Abdominal Pain   HPIpt is G5P1303 at 7064w3d pregnant and presents with abd pain and nausea.  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  Pt denies spotting, bleeding, LOF or UTI sx.  Pt is having ctx. RN note: Pt. States she has been having lower abdominal pain for 2 weeks, the Dr. told her she was 2 cm. The pain goes from her back to front. She was [facility] today [facility]   Past Medical History  Diagnosis Date  . Anemia   . Preterm labor   . Infection     UTI  . Eczema   . Abnormal Pap smear   . Genital herpes     Past Surgical History  Procedure Laterality Date  . Tonsillectomy    . Wisdom tooth extraction      Family History  Problem Relation Age of Onset  . Other Neg Hx   . Hypertension Mother   . Hypertension Father   . Hypertension Maternal Grandmother   . Cancer Paternal Uncle     1 prostate, 1 colon    History  Substance Use Topics  . Smoking status: Never Smoker   . Smokeless tobacco: Never Used  . Alcohol Use: No    Allergies:  Allergies  Allergen Reactions  . Percocet [Oxycodone-Acetaminophen] Nausea And Vomiting    Prescriptions prior to admission  Medication Sig Dispense Refill  . Prenatal Vit-Fe Fumarate-FA (PRENATAL MULTIVITAMIN) TABS tablet Take 1 tablet by mouth daily at 12 noon.        Review of Systems  Constitutional: Negative for fever and chills.  Gastrointestinal: Positive for nausea and abdominal pain. Negative for vomiting.  Genitourinary: Negative for dysuria and urgency.   Physical Exam   Blood pressure 127/78, pulse 73, temperature 98.4 F (36.9 C), temperature source Oral, resp. rate 20, last menstrual period 12/31/2012.  Physical Exam  Nursing note and vitals reviewed. Constitutional: She is oriented to  person, place, and time. She appears well-developed and well-nourished. No distress.  Appears not happy  HENT:  Head: Normocephalic.  Eyes: Pupils are equal, round, and reactive to light.  Neck: Normal range of motion. Neck supple.  Respiratory: Effort normal.  GI: Soft. She exhibits no distension. There is no tenderness.  Genitourinary:  Cervical exam by nurse- 3 cm; speculum exam reveals pink moist mucosa- no evidence of any vaginal HSV lesion  Musculoskeletal: Normal range of motion.  Neurological: She is alert and oriented to person, place, and time.  Skin: Skin is warm and dry.  Psychiatric: She has a normal mood and affect.    MAU Course  Procedures labor eval by nurse Consulted Dr. Chestine Sporelark- will admit pt    Assessment and Plan   Tina Lambert 09/25/2013, 6:39 PM

## 2013-09-25 NOTE — H&P (Signed)
28 y.o. 6022w3d  U9W1191G5P3103 comes in c/o abdominal pain.  Reports intermittent ctx for the past week.  Was noted to have a 1 min decel to the 90s.  Fetal monitoring otherwise reassuring.  SVE after decel noted to be 3/50/-1 (h/o 21 wk svd w G1 followed by 3 term deliveries, on 17-P this pregnancy).      Past Medical History  Diagnosis Date  . Anemia   . Preterm labor   . Infection     UTI  . Eczema   . Abnormal Pap smear   . Genital herpes     Past Surgical History  Procedure Laterality Date  . Tonsillectomy    . Wisdom tooth extraction      OB History  Gravida Para Term Preterm AB SAB TAB Ectopic Multiple Living  5 4 3 1  0 0    3    # Outcome Date GA Lbr Len/2nd Weight Sex Delivery Anes PTL Lv  5 CUR           4 TRM 06/24/12 3751w1d 05:38 / 00:14 2.3 kg (5 lb 1.1 oz) M SVD EPI  Y     Comments: None  3 TRM 2011 8145w4d 08:00 2.325 kg (5 lb 2 oz) F SVD None Y Y  2 TRM 2009 7028w3d 16:00 3.118 kg (6 lb 14 oz) F SVD None N Y  1 PRE 2007 1926w0d   F SVD None Y ND      History   Social History  . Marital Status: Single    Spouse Name: N/A    Number of Children: N/A  . Years of Education: N/A   Occupational History  . Not on file.   Social History Main Topics  . Smoking status: Never Smoker   . Smokeless tobacco: Never Used  . Alcohol Use: No  . Drug Use: No  . Sexual Activity: Yes    Birth Control/ Protection: None   Other Topics Concern  . Not on file   Social History Narrative  . No narrative on file   Percocet    Prenatal Transfer Tool  Maternal Diabetes: No Genetic Screening: Declined Maternal Ultrasounds/Referrals: Normal Fetal Ultrasounds or other Referrals:  None Maternal Substance Abuse:  No Significant Maternal Medications:  None Significant Maternal Lab Results: None  Other PNC: h/o prior PTB at 21 wks w G1.  G2-G4 term deliveries. On 17-P this pregnancy.    Filed Vitals:   09/25/13 1633  BP: 127/78  Pulse: 73  Temp: 98.4 F (36.9 C)  Resp: 20      Lungs/Cor:  NAD Abdomen:  soft, gravid Ex:  no cords, erythema SVE:  3/50/posterior/ballotable FHTs:  130, mod variability, variable decel to 90s x 1 min, w spontaneous recovery to baseline, + accels, Cat 2 Toco:  irritible   A/P  Y7W2956G5P3103 @ 5422w3d here for extended fetal monitoring, ?TPTL Variable decel to 90s in MAU, but fetal heart rate tracing is otherwise reactive with accelerations.  Monitor X 2 hrs.  If reassuring, d/c home w follow up in clinic next week.  ?TPTL: h/o prior PTB, SVE is 3cm, but ballotable and doesn't feel threatening.  Pt appears comfortable; not picking up contractions.  Will hydrate w IVF.   GBS +: ampicillin if labors  [place], [place]

## 2013-09-25 NOTE — Progress Notes (Signed)
Pt sts she has had on and off vomiting for 2 weeks

## 2013-09-26 NOTE — Progress Notes (Signed)
Post discharge ur review completed. 

## 2013-09-29 NOTE — MAU Provider Note (Signed)
H&P discussed w Pamelia Hoit, NP.   Admit for extended fetal monitoring.

## 2013-10-02 ENCOUNTER — Inpatient Hospital Stay (HOSPITAL_COMMUNITY)
Admission: AD | Admit: 2013-10-02 | Discharge: 2013-10-04 | DRG: 774 | Disposition: A | Discharge status: Home / Self Care | Payer: Medicaid Other | Source: Ambulatory Visit | Attending: Obstetrics & Gynecology | Admitting: Obstetrics & Gynecology

## 2013-10-02 ENCOUNTER — Encounter (HOSPITAL_COMMUNITY): Payer: Self-pay | Admitting: *Deleted

## 2013-10-02 DIAGNOSIS — D509 Iron deficiency anemia, unspecified: Secondary | ICD-10-CM | POA: Diagnosis present

## 2013-10-02 DIAGNOSIS — O9989 Other specified diseases and conditions complicating pregnancy, childbirth and the puerperium: Secondary | ICD-10-CM

## 2013-10-02 DIAGNOSIS — E669 Obesity, unspecified: Secondary | ICD-10-CM | POA: Diagnosis present

## 2013-10-02 DIAGNOSIS — Z8249 Family history of ischemic heart disease and other diseases of the circulatory system: Secondary | ICD-10-CM | POA: Diagnosis not present

## 2013-10-02 DIAGNOSIS — O9902 Anemia complicating childbirth: Secondary | ICD-10-CM | POA: Diagnosis present

## 2013-10-02 DIAGNOSIS — Z6841 Body Mass Index (BMI) 40.0 and over, adult: Secondary | ICD-10-CM | POA: Diagnosis not present

## 2013-10-02 DIAGNOSIS — A6000 Herpesviral infection of urogenital system, unspecified: Secondary | ICD-10-CM | POA: Diagnosis present

## 2013-10-02 DIAGNOSIS — O479 False labor, unspecified: Secondary | ICD-10-CM | POA: Diagnosis present

## 2013-10-02 DIAGNOSIS — O139 Gestational [pregnancy-induced] hypertension without significant proteinuria, unspecified trimester: Secondary | ICD-10-CM | POA: Diagnosis present

## 2013-10-02 DIAGNOSIS — O98519 Other viral diseases complicating pregnancy, unspecified trimester: Secondary | ICD-10-CM | POA: Diagnosis present

## 2013-10-02 DIAGNOSIS — Z22330 Carrier of Group B streptococcus: Secondary | ICD-10-CM | POA: Diagnosis not present

## 2013-10-02 DIAGNOSIS — O99892 Other specified diseases and conditions complicating childbirth: Secondary | ICD-10-CM | POA: Diagnosis present

## 2013-10-02 DIAGNOSIS — O1490 Unspecified pre-eclampsia, unspecified trimester: Secondary | ICD-10-CM | POA: Diagnosis present

## 2013-10-02 DIAGNOSIS — O99214 Obesity complicating childbirth: Secondary | ICD-10-CM

## 2013-10-02 HISTORY — DX: Personal history of gestational diabetes: Z86.32

## 2013-10-02 HISTORY — DX: Nontoxic goiter, unspecified: E04.9

## 2013-10-02 HISTORY — DX: Obesity, unspecified: E66.9

## 2013-10-02 HISTORY — DX: Supervision of pregnancy with other poor reproductive or obstetric history, unspecified trimester: O09.299

## 2013-10-02 LAB — COMPREHENSIVE METABOLIC PANEL
ALT: 66 U/L — AB (ref 0–35)
AST: 41 U/L — AB (ref 0–37)
Albumin: 2.7 g/dL — ABNORMAL LOW (ref 3.5–5.2)
Alkaline Phosphatase: 181 U/L — ABNORMAL HIGH (ref 39–117)
Anion gap: 10 (ref 5–15)
BILIRUBIN TOTAL: 0.3 mg/dL (ref 0.3–1.2)
BUN: 5 mg/dL — ABNORMAL LOW (ref 6–23)
CALCIUM: 8.3 mg/dL — AB (ref 8.4–10.5)
CHLORIDE: 103 meq/L (ref 96–112)
CO2: 23 mEq/L (ref 19–32)
Creatinine, Ser: 0.61 mg/dL (ref 0.50–1.10)
GFR calc non Af Amer: >90 mL/min (ref >90)
Glucose, Bld: 79 mg/dL (ref 70–99)
Potassium: 4.1 mEq/L (ref 3.7–5.3)
SODIUM: 136 meq/L — AB (ref 137–147)
Total Protein: 6.1 g/dL (ref 6.0–8.3)

## 2013-10-02 LAB — TYPE AND SCREEN
ABO/RH(D): O POS
Antibody Screen: NEGATIVE

## 2013-10-02 LAB — CBC
HCT: 30.1 % — ABNORMAL LOW (ref 36.0–46.0)
HEMOGLOBIN: 9.7 g/dL — AB (ref 12.0–15.0)
MCH: 24.7 pg — AB (ref 26.0–34.0)
MCHC: 32.2 g/dL (ref 30.0–36.0)
MCV: 76.8 fL — AB (ref 78.0–100.0)
PLATELETS: 220 10*3/uL (ref 150–400)
RBC: 3.92 MIL/uL (ref 3.87–5.11)
RDW: 13.3 % (ref 11.5–15.5)
WBC: 4.9 10*3/uL (ref 4.0–10.5)

## 2013-10-02 LAB — LACTATE DEHYDROGENASE: LDH: 236 U/L (ref 94–250)

## 2013-10-02 LAB — PROTEIN / CREATININE RATIO, URINE
Creatinine, Urine: 116.79 mg/dL
PROTEIN CREATININE RATIO: 0.07 (ref 0.00–0.15)
Total Protein, Urine: 8 mg/dL

## 2013-10-02 LAB — RAPID HIV SCREEN (WH-MAU): Rapid HIV Screen: NONREACTIVE

## 2013-10-02 LAB — OB RESULTS CONSOLE HIV ANTIBODY (ROUTINE TESTING): HIV: NONREACTIVE

## 2013-10-02 LAB — URIC ACID: Uric Acid, Serum: 4 mg/dL (ref 2.4–7.0)

## 2013-10-02 MED ORDER — PENICILLIN G POTASSIUM 5000000 UNITS IJ SOLR
5.0000 10*6.[IU] | Freq: Once | INTRAVENOUS | Status: AC
  Administered 2013-10-02: 5 10*6.[IU] via INTRAVENOUS
  Filled 2013-10-02: qty 5

## 2013-10-02 MED ORDER — OXYTOCIN 40 UNITS IN LACTATED RINGERS INFUSION - SIMPLE MED: 62.5000 mL/h | INTRAVENOUS | Status: DC

## 2013-10-02 MED ORDER — ONDANSETRON HCL 4 MG/2ML IJ SOLN: 4.0000 mg | Freq: Four times a day (QID) | INTRAMUSCULAR | Status: DC | PRN

## 2013-10-02 MED ORDER — OXYTOCIN BOLUS FROM INFUSION: 500.0000 mL | INTRAVENOUS | Status: DC

## 2013-10-02 MED ORDER — TERBUTALINE SULFATE 1 MG/ML IJ SOLN: 0.2500 mg | Freq: Once | INTRAMUSCULAR | Status: AC | PRN

## 2013-10-02 MED ORDER — LIDOCAINE HCL (PF) 1 % IJ SOLN
30.0000 mL | INTRAMUSCULAR | Status: DC | PRN
  Filled 2013-10-02: qty 30

## 2013-10-02 MED ORDER — CITRIC ACID-SODIUM CITRATE 334-500 MG/5ML PO SOLN: 30.0000 mL | ORAL | Status: DC | PRN

## 2013-10-02 MED ORDER — ACETAMINOPHEN 325 MG PO TABS: 650.0000 mg | ORAL_TABLET | ORAL | Status: DC | PRN

## 2013-10-02 MED ORDER — OXYCODONE-ACETAMINOPHEN 5-325 MG PO TABS: 1.0000 | ORAL_TABLET | ORAL | Status: DC | PRN

## 2013-10-02 MED ORDER — IBUPROFEN 600 MG PO TABS
600.0000 mg | ORAL_TABLET | Freq: Four times a day (QID) | ORAL | Status: DC | PRN
Start: 2013-10-02 — End: 2013-10-03

## 2013-10-02 MED ORDER — PENICILLIN G POTASSIUM 5000000 UNITS IJ SOLR
2.5000 10*6.[IU] | INTRAVENOUS | Status: DC
  Administered 2013-10-03: 2.5 10*6.[IU] via INTRAVENOUS
  Filled 2013-10-02 (×11): qty 2.5

## 2013-10-02 MED ORDER — LACTATED RINGERS IV SOLN
500.0000 mL | INTRAVENOUS | Status: DC | PRN
Start: 2013-10-02 — End: 2013-10-03

## 2013-10-02 MED ORDER — OXYTOCIN 40 UNITS IN LACTATED RINGERS INFUSION - SIMPLE MED
1.0000 m[IU]/min | INTRAVENOUS | Status: DC
  Administered 2013-10-02: 2 m[IU]/min via INTRAVENOUS
  Filled 2013-10-02: qty 1000

## 2013-10-02 MED ORDER — LACTATED RINGERS IV SOLN
INTRAVENOUS | Status: DC
  Administered 2013-10-02: 21:00:00 via INTRAVENOUS

## 2013-10-02 NOTE — MAU Note (Signed)
Report given to [facility] CCC/BS. Patient will be triaged in room 170.

## 2013-10-02 NOTE — H&P (Signed)
Tina Lambert is a 28 y.o. female presenting for contractions, headache, visual changes.  Maternal Medical History:  Reason for admission: Contractions.  Nausea.  Contractions: Onset was 6-12 hours ago.   Frequency: regular.    Fetal activity: Perceived fetal activity is decreased.   Last perceived fetal movement was within the past 12 hours.    Prenatal complications: Infection.   Small for GA fetus with AC lag 3%  Prenatal Complications - Diabetes: none.    OB History   Grav Para Term Preterm Abortions TAB SAB Ect Mult Living   0  0   3     Past Medical History  Diagnosis Date  . Anemia   . Preterm labor   . Infection     UTI  . Eczema   . Abnormal Pap smear   . Genital herpes    Past Surgical History  Procedure Laterality Date  . Tonsillectomy    . Wisdom tooth extraction     Family History: family history includes Cancer in her paternal uncle; Hypertension in her father, maternal grandmother, and mother. There is no history of Other. Social History:  reports that she has never smoked. She has never used smokeless tobacco. She reports that she does not drink alcohol or use illicit drugs.   Prenatal Transfer Tool  Maternal Diabetes: No Genetic Screening: Declined Maternal Ultrasounds/Referrals: Abnormal:  Findings:   Other: AC lag <3% last EFW 15% Fetal Ultrasounds or other Referrals:  None Maternal Substance Abuse:  No Significant Maternal Medications:  Meds include: Other: Valtrex, s/p Makena therapy Significant Maternal Lab Results:  Lab values include: Group B Strep positive Other Comments:  None  Review of Systems  Constitutional: Negative for fever and chills.  Eyes: Positive for blurred vision.  Respiratory: Negative for cough.   Cardiovascular: Negative for chest pain.  Gastrointestinal: Negative for heartburn and nausea.  Genitourinary: Negative for dysuria.  Skin: Negative for rash.  Neurological: Positive for headaches. Negative  for dizziness.  Endo/Heme/Allergies: Negative for environmental allergies. Does not bruise/bleed easily.  Psychiatric/Behavioral: Negative for depression.  All other systems reviewed and are negative.   Dilation: 3 Effacement (%): 70 Station: -2;-3 Exam by:: hk Blood pressure 119/93, pulse 77, temperature 98.1 F (36.7 C), temperature source Oral, resp. rate 18, last menstrual period 12/31/2012.  Maternal Exam:  Uterine Assessment: Contraction strength is mild.  Contraction duration is 60 seconds. Contraction frequency is irregular.   Abdomen: Patient reports no abdominal tenderness. Fundal height is 37 cm.   Estimated fetal weight is 2400 grams.   Fetal presentation: vertex  Introitus: Normal vulva. Vulva is negative for lesion.  Vagina is negative for ulcerations.  Ferning test: not done.  Nitrazine test: not done. Amniotic fluid character: not assessed.  Pelvis: adequate for delivery.   Cervix: Cervix evaluated by digital exam.   3 /70/-2  Fetal Exam Fetal Monitor Review: Mode: hand-held doppler probe.   Baseline rate: 140.  Variability: moderate (6-25 bpm).   Pattern: accelerations present and no decelerations.    Fetal State Assessment: Category I - tracings are normal.     Physical Exam  Constitutional: She is oriented to person, place, and time. She appears well-developed and well-nourished.  HENT:  Head: Normocephalic.  Eyes: Pupils are equal, round, and reactive to light.  Neck: Normal range of motion.  Cardiovascular: Normal rate and regular rhythm.   Respiratory: Effort normal.  GI: Soft.  Genitourinary: Vulva exhibits no lesion.  Neurological: She is alert  and oriented to person, place, and time.    Prenatal labs: ABO, Rh: O/Positive/-- (02/05 0000) Antibody: Negative (02/05 0000) Rubella: Immune (02/05 0000) RPR: Nonreactive (02/05 0000)  HBsAg: Negative (02/05 0000)  HIV: NEGATIVE GBS: Positive (08/12 0000)   Assessment/Plan: 28 yo G5P2113  SIUP at 37 weeks 3 days with mild range BPs, HA, visual changes and elevated LFTs.  Concern for preeclampsia with severe features Will admit to Labor and Delivery for induction of labor with pitocin   Prenatal course complicated by SGA fetus last EFW 15% with AC lag of 3% She has h/o preterm labor with first pregnancy and loss at 21 weeks   Awaiting urine results before making decision about magnesium sulfate neuroprophylaxis Prenatal flow sheet shows positive HIV in ERROR, the lab value is NEGATIVE Patient with h/o HSV and is on Valtrex for prophylaxis no vulvar or vaginal lesions.  She is GBS positive with no PCN allergy, will start PCN IV  Continuous monitoring IV hydration Epidural on demand     Tina Lambert Tina Lambert 10/02/2013, 8:28 PM

## 2013-10-03 ENCOUNTER — Encounter (HOSPITAL_COMMUNITY): Payer: Medicaid Other | Admitting: Anesthesiology

## 2013-10-03 ENCOUNTER — Encounter (HOSPITAL_COMMUNITY): Payer: Self-pay | Admitting: Anesthesiology

## 2013-10-03 ENCOUNTER — Inpatient Hospital Stay (HOSPITAL_COMMUNITY): Payer: Medicaid Other | Admitting: Anesthesiology

## 2013-10-03 DIAGNOSIS — O139 Gestational [pregnancy-induced] hypertension without significant proteinuria, unspecified trimester: Secondary | ICD-10-CM

## 2013-10-03 DIAGNOSIS — Z6841 Body Mass Index (BMI) 40.0 and over, adult: Secondary | ICD-10-CM

## 2013-10-03 DIAGNOSIS — O98519 Other viral diseases complicating pregnancy, unspecified trimester: Secondary | ICD-10-CM

## 2013-10-03 DIAGNOSIS — O9902 Anemia complicating childbirth: Secondary | ICD-10-CM

## 2013-10-03 MED ORDER — OXYCODONE-ACETAMINOPHEN 5-325 MG PO TABS: 1.0000 | ORAL_TABLET | ORAL | Status: DC | PRN

## 2013-10-03 MED ORDER — DIPHENHYDRAMINE HCL 25 MG PO CAPS: 25.0000 mg | ORAL_CAPSULE | Freq: Four times a day (QID) | ORAL | Status: DC | PRN

## 2013-10-03 MED ORDER — EPHEDRINE 5 MG/ML INJ
10.0000 mg | INTRAVENOUS | Status: DC | PRN
  Filled 2013-10-03: qty 2

## 2013-10-03 MED ORDER — ONDANSETRON HCL 4 MG PO TABS: 4.0000 mg | ORAL_TABLET | ORAL | Status: DC | PRN

## 2013-10-03 MED ORDER — IBUPROFEN 600 MG PO TABS
600.0000 mg | ORAL_TABLET | Freq: Four times a day (QID) | ORAL | Status: DC
  Administered 2013-10-03 – 2013-10-04 (×4): 600 mg via ORAL
  Filled 2013-10-03 (×4): qty 1

## 2013-10-03 MED ORDER — DIPHENHYDRAMINE HCL 50 MG/ML IJ SOLN: 12.5000 mg | INTRAMUSCULAR | Status: DC | PRN

## 2013-10-03 MED ORDER — DIBUCAINE 1 % RE OINT
1.0000 "application " | TOPICAL_OINTMENT | RECTAL | Status: DC | PRN
  Filled 2013-10-03: qty 28

## 2013-10-03 MED ORDER — SIMETHICONE 80 MG PO CHEW: 80.0000 mg | CHEWABLE_TABLET | ORAL | Status: DC | PRN

## 2013-10-03 MED ORDER — PHENYLEPHRINE 40 MCG/ML (10ML) SYRINGE FOR IV PUSH (FOR BLOOD PRESSURE SUPPORT)
80.0000 ug | PREFILLED_SYRINGE | INTRAVENOUS | Status: DC | PRN
  Filled 2013-10-03: qty 2

## 2013-10-03 MED ORDER — LACTATED RINGERS IV SOLN: 500.0000 mL | Freq: Once | INTRAVENOUS | Status: DC

## 2013-10-03 MED ORDER — LIDOCAINE HCL (PF) 1 % IJ SOLN
INTRAMUSCULAR | Status: DC | PRN
  Administered 2013-10-03 (×2): 8 mL

## 2013-10-03 MED ORDER — FENTANYL 2.5 MCG/ML BUPIVACAINE 1/10 % EPIDURAL INFUSION (WH - ANES)
INTRAMUSCULAR | Status: DC | PRN
  Administered 2013-10-03: 14 mL/h via EPIDURAL

## 2013-10-03 MED ORDER — ZOLPIDEM TARTRATE 5 MG PO TABS: 5.0000 mg | ORAL_TABLET | Freq: Every evening | ORAL | Status: DC | PRN

## 2013-10-03 MED ORDER — ONDANSETRON HCL 4 MG/2ML IJ SOLN
4.0000 mg | INTRAMUSCULAR | Status: DC | PRN
Start: 2013-10-03 — End: 2013-10-04

## 2013-10-03 MED ORDER — FENTANYL 2.5 MCG/ML BUPIVACAINE 1/10 % EPIDURAL INFUSION (WH - ANES)
14.0000 mL/h | INTRAMUSCULAR | Status: DC | PRN
  Administered 2013-10-03: 14 mL/h via EPIDURAL

## 2013-10-03 MED ORDER — LANOLIN HYDROUS EX OINT: TOPICAL_OINTMENT | CUTANEOUS | Status: DC | PRN

## 2013-10-03 MED ORDER — FENTANYL 2.5 MCG/ML BUPIVACAINE 1/10 % EPIDURAL INFUSION (WH - ANES)
INTRAMUSCULAR | Status: AC
  Filled 2013-10-03: qty 125

## 2013-10-03 MED ORDER — BENZOCAINE-MENTHOL 20-0.5 % EX AERO
1.0000 "application " | INHALATION_SPRAY | CUTANEOUS | Status: DC | PRN
  Filled 2013-10-03: qty 56

## 2013-10-03 MED ORDER — OXYTOCIN 40 UNITS IN LACTATED RINGERS INFUSION - SIMPLE MED: 62.5000 mL/h | INTRAVENOUS | Status: DC | PRN

## 2013-10-03 MED ORDER — PHENYLEPHRINE 40 MCG/ML (10ML) SYRINGE FOR IV PUSH (FOR BLOOD PRESSURE SUPPORT)
PREFILLED_SYRINGE | INTRAVENOUS | Status: AC
  Filled 2013-10-03: qty 10

## 2013-10-03 MED ORDER — PRENATAL MULTIVITAMIN CH
1.0000 | ORAL_TABLET | Freq: Every day | ORAL | Status: DC
  Filled 2013-10-03: qty 1

## 2013-10-03 MED ORDER — TETANUS-DIPHTH-ACELL PERTUSSIS 5-2.5-18.5 LF-MCG/0.5 IM SUSP
0.5000 mL | Freq: Once | INTRAMUSCULAR | Status: AC
  Administered 2013-10-04: 0.5 mL via INTRAMUSCULAR

## 2013-10-03 MED ORDER — SENNOSIDES-DOCUSATE SODIUM 8.6-50 MG PO TABS
2.0000 | ORAL_TABLET | ORAL | Status: DC
  Administered 2013-10-04: 2 via ORAL
  Filled 2013-10-03: qty 2

## 2013-10-03 MED ORDER — WITCH HAZEL-GLYCERIN EX PADS: 1.0000 "application " | MEDICATED_PAD | CUTANEOUS | Status: DC | PRN

## 2013-10-03 NOTE — Anesthesia Preprocedure Evaluation (Signed)
Anesthesia Evaluation  Patient identified by MRN, date of birth, ID band Patient awake    Reviewed: Allergy & Precautions, H&P , NPO status , Patient's Chart, lab work & pertinent test results  Airway Mallampati: II TM Distance: >3 FB Neck ROM: full    Dental no notable dental hx.    Pulmonary neg pulmonary ROS,    Pulmonary exam normal       Cardiovascular negative cardio ROS      Neuro/Psych negative neurological ROS  negative psych ROS   GI/Hepatic negative GI ROS, Neg liver ROS,   Endo/Other  Morbid obesity  Renal/GU negative Renal ROS     Musculoskeletal   Abdominal (+) + obese,   Peds  Hematology   Anesthesia Other Findings   Reproductive/Obstetrics (+) Pregnancy                           Anesthesia Physical Anesthesia Plan  ASA: III  Anesthesia Plan: Epidural   Post-op Pain Management:    Induction:   Airway Management Planned:   Additional Equipment:   Intra-op Plan:   Post-operative Plan:   Informed Consent: I have reviewed the patients History and Physical, chart, labs and discussed the procedure including the risks, benefits and alternatives for the proposed anesthesia with the patient or authorized representative who has indicated his/her understanding and acceptance.     Plan Discussed with:   Anesthesia Plan Comments:         Anesthesia Quick Evaluation

## 2013-10-03 NOTE — Anesthesia Postprocedure Evaluation (Signed)
Anesthesia Post Note  Patient: Tina Lambert  Procedure(s) Performed: * No procedures listed *  Anesthesia type: Epidural  Patient location: Mother/Baby  Post pain: Pain level controlled  Post assessment: Post-op Vital signs reviewed  Last Vitals:  Filed Vitals:   10/03/13 0740  BP: 118/76  Pulse: 80  Temp: 36.9 C  Resp: 18    Post vital signs: Reviewed  Level of consciousness:alert  Complications: No apparent anesthesia complications

## 2013-10-03 NOTE — Anesthesia Procedure Notes (Signed)
Epidural Patient location during procedure: OB Start time: 10/03/2013 4:02 AM End time: 10/03/2013 4:06 AM  Staffing Anesthesiologist: Leilani Able Performed by: anesthesiologist   Preanesthetic Checklist Completed: patient identified, surgical consent, pre-op evaluation, timeout performed, IV checked, risks and benefits discussed and monitors and equipment checked  Epidural Patient position: sitting Prep: site prepped and draped and DuraPrep Patient monitoring: continuous pulse ox and blood pressure Approach: midline Location: L3-L4 Injection technique: LOR air  Needle:  Needle type: Tuohy  Needle gauge: 17 G Needle length: 9 cm and 9 Needle insertion depth: 6 cm Catheter type: closed end flexible Catheter size: 19 Gauge Catheter at skin depth: 11 cm Test dose: negative and Other  Assessment Sensory level: T9 Events: blood not aspirated, injection not painful, no injection resistance, negative IV test and no paresthesia  Additional Notes Reason for block:procedure for pain

## 2013-10-03 NOTE — Progress Notes (Signed)
Called from home to patient bedside. She had already precipitously delivered after receiving her epidural. Prior to her epidural she was 5cm. She was delivered by the hospital CNM.  Perineum re-examined and there were no noted lacerations. Placenta was also delivered spontaneously by CNM intact 3 vessels noted.  Patient doing well.  Viable female infant, appears small for gestational age. Weight 2345 grams.  Babs Dabbs [name]

## 2013-10-03 NOTE — Progress Notes (Signed)
UR chart review completed.  

## 2013-10-03 NOTE — Lactation Note (Signed)
This note was copied from the chart of Tina Lambert. Lactation Consultation Note  Patient Name: Tina Jemya Depierro [id number] Date: 10/03/2013  Mom plans to breast and bottle feed. Mom reports baby will not latch at this point so she has been supplementing. Baby recently fed so did not get to assist with latch. Mom reports she is experienced BF with last baby. LC stressed to Mom importance of attempting to latch baby with each feeding. Reviewed normal newborn behaviors in the 1st 24 hours. LC reviewed guidelines for supplementing with breastfeeding. Mom reports she hand expressed and spoon fed with her older son in the 1st few days trying to get him to latch and will try this with this baby if he continues to have difficulty with latch. Encouraged Mom to call with next feeding and LC would demonstrate hand expression and assist with spoon feeding some colostrum. LC left phone number for Mom to call. Advised Mom if she wants to provide breast milk she needs to either BF or pump to encourage milk production, prevent engorgement and protect milk supply. Mom may be interested in pumping, she will advise. LC left lactation brochure, advised of OP services and support group. Encouraged Mom to call for assist.    Maternal Data    Feeding Feeding Type: Formula Nipple Type: Slow - flow  LATCH Score/Interventions                      Lactation Tools Discussed/Used     Consult Status      Alfred Levins 10/03/2013, 5:17 PM

## 2013-10-04 LAB — CBC
HCT: 28.6 % — ABNORMAL LOW (ref 36.0–46.0)
Hemoglobin: 9.3 g/dL — ABNORMAL LOW (ref 12.0–15.0)
MCH: 24.9 pg — ABNORMAL LOW (ref 26.0–34.0)
MCHC: 32.5 g/dL (ref 30.0–36.0)
MCV: 76.7 fL — AB (ref 78.0–100.0)
PLATELETS: 221 10*3/uL (ref 150–400)
RBC: 3.73 MIL/uL — AB (ref 3.87–5.11)
RDW: 13.3 % (ref 11.5–15.5)
WBC: 5.6 10*3/uL (ref 4.0–10.5)

## 2013-10-04 LAB — RPR

## 2013-10-04 MED ORDER — FERROUS SULFATE 325 (65 FE) MG PO TBEC: 325.0000 mg | DELAYED_RELEASE_TABLET | Freq: Two times a day (BID) | ORAL | Status: DC

## 2013-10-04 MED ORDER — IBUPROFEN 600 MG PO TABS: 600.0000 mg | ORAL_TABLET | Freq: Four times a day (QID) | ORAL | Status: DC | PRN

## 2013-10-04 NOTE — Discharge Instructions (Signed)

## 2013-10-04 NOTE — Progress Notes (Addendum)
Patient is eating, ambulating, voiding.  Pain control is good.  Bleeding in minimal  Filed Vitals:   10/03/13 1128 10/03/13 1808 10/03/13 2015 10/04/13 0627  BP: 112/84 119/94 125/72 107/77  Pulse: 66 75 96 87  Temp: 97.5 F (36.4 C) 97.5 F (36.4 C) 98.4 F (36.9 C) 98 F (36.7 C)  TempSrc: Oral Oral Oral Oral  Resp: Height:      Weight:      SpO2:   99% 100%    Fundus firm   Lab Results  Component Value Date   WBC 5.6 10/04/2013   HGB 9.3* 10/04/2013   HCT 28.6* 10/04/2013   MCV 76.7* 10/04/2013   PLT 221 10/04/2013    O+  A/P Post partum day 1. IDA: continue iron supplementation Desires PP BTL, but no OR availability.  Will plan interval tubal w Dr. Mora Appl.  Pt amenable to this plan Elevated BPs normalized after delivery  Routine care.  Expect d/c today.   Desires circ in office.  [place], [facility]

## 2013-10-04 NOTE — Discharge Summary (Signed)
Obstetric Discharge Summary Reason for Admission: onset of labor, GHTN Prenatal Procedures: NST Intrapartum Procedures: spontaneous vaginal delivery Postpartum Procedures: none Complications-Operative and Postpartum: none Hemoglobin  Date Value Ref Range Status  10/04/2013 9.3* 12.0 - 15.0 g/dL Final     HCT  Date Value Ref Range Status  10/04/2013 28.6* 36.0 - 46.0 % Final    Physical Exam:  General: alert, cooperative and no distress Lochia: appropriate Uterine Fundus: firm  DVT Evaluation: No evidence of DVT seen on physical exam.  Discharge Diagnoses: Term Pregnancy-delivered  Discharge Information: Date: 10/04/2013 Activity: pelvic rest Diet: routine Medications: PNV, Ibuprofen, Colace and Iron Condition: stable Instructions: refer to practice specific booklet Discharge to: home Follow-up Information   Follow up with Wynonia Hazard, MD On 11/05/2013. (at 3:00 pm)    Specialty:  Obstetrics and Gynecology   Contact information:   [address] [place] [address] [phone]       Follow up with Wynonia Hazard, MD. Call in 4 weeks.   Specialty:  Obstetrics and Gynecology   Contact information:   [address] [place] [address] [phone]       Newborn Data: Live born female  Birth Weight: 5 lb 2.7 oz (2345 g) APGAR: 9, 9  Home with mother. Is considering circ, will obtain in office.    Marlow Baars 10/04/2013, 8:45 AM

## 2013-11-06 ENCOUNTER — Encounter (HOSPITAL_COMMUNITY): Payer: Self-pay | Admitting: Anesthesiology

## 2013-11-06 ENCOUNTER — Ambulatory Visit (HOSPITAL_COMMUNITY)
Admission: RE | Admit: 2013-11-06 | Payer: Medicaid Other | Source: Ambulatory Visit | Admitting: Obstetrics & Gynecology

## 2013-11-06 ENCOUNTER — Encounter (HOSPITAL_COMMUNITY): Admission: RE | Payer: Self-pay | Source: Ambulatory Visit

## 2013-11-06 SURGERY — LIGATION, FALLOPIAN TUBE, LAPAROSCOPIC
Anesthesia: Choice | Laterality: Bilateral

## 2013-12-08 ENCOUNTER — Encounter (HOSPITAL_COMMUNITY): Payer: Self-pay | Admitting: Anesthesiology

## 2014-01-16 ENCOUNTER — Ambulatory Visit: Admit: 2014-01-16 | Payer: Self-pay | Admitting: Obstetrics & Gynecology

## 2014-01-16 SURGERY — LIGATION, FALLOPIAN TUBE, LAPAROSCOPIC
Anesthesia: Choice

## 2015-01-15 ENCOUNTER — Emergency Department (HOSPITAL_BASED_OUTPATIENT_CLINIC_OR_DEPARTMENT_OTHER)
Admission: EM | Admit: 2015-01-15 | Discharge: 2015-01-16 | Disposition: A | Discharge status: Home / Self Care | Payer: No Typology Code available for payment source | Attending: Emergency Medicine | Admitting: Emergency Medicine

## 2015-01-15 ENCOUNTER — Encounter (HOSPITAL_BASED_OUTPATIENT_CLINIC_OR_DEPARTMENT_OTHER): Payer: Self-pay | Admitting: *Deleted

## 2015-01-15 DIAGNOSIS — S29002A Unspecified injury of muscle and tendon of back wall of thorax, initial encounter: Secondary | ICD-10-CM | POA: Diagnosis not present

## 2015-01-15 DIAGNOSIS — Y92410 Unspecified street and highway as the place of occurrence of the external cause: Secondary | ICD-10-CM | POA: Diagnosis not present

## 2015-01-15 DIAGNOSIS — Z8639 Personal history of other endocrine, nutritional and metabolic disease: Secondary | ICD-10-CM | POA: Diagnosis not present

## 2015-01-15 DIAGNOSIS — Z872 Personal history of diseases of the skin and subcutaneous tissue: Secondary | ICD-10-CM | POA: Insufficient documentation

## 2015-01-15 DIAGNOSIS — Z8632 Personal history of gestational diabetes: Secondary | ICD-10-CM | POA: Diagnosis not present

## 2015-01-15 DIAGNOSIS — Z87440 Personal history of urinary (tract) infections: Secondary | ICD-10-CM | POA: Diagnosis not present

## 2015-01-15 DIAGNOSIS — S0083XA Contusion of other part of head, initial encounter: Secondary | ICD-10-CM

## 2015-01-15 DIAGNOSIS — E669 Obesity, unspecified: Secondary | ICD-10-CM | POA: Diagnosis not present

## 2015-01-15 DIAGNOSIS — S199XXA Unspecified injury of neck, initial encounter: Secondary | ICD-10-CM | POA: Diagnosis not present

## 2015-01-15 DIAGNOSIS — Y9389 Activity, other specified: Secondary | ICD-10-CM | POA: Diagnosis not present

## 2015-01-15 DIAGNOSIS — Y998 Other external cause status: Secondary | ICD-10-CM | POA: Diagnosis not present

## 2015-01-15 DIAGNOSIS — Z8619 Personal history of other infectious and parasitic diseases: Secondary | ICD-10-CM | POA: Diagnosis not present

## 2015-01-15 DIAGNOSIS — D649 Anemia, unspecified: Secondary | ICD-10-CM | POA: Diagnosis not present

## 2015-01-15 DIAGNOSIS — S0990XA Unspecified injury of head, initial encounter: Secondary | ICD-10-CM | POA: Diagnosis present

## 2015-01-15 MED ORDER — IBUPROFEN 600 MG PO TABS: 600.0000 mg | ORAL_TABLET | Freq: Four times a day (QID) | ORAL | Status: DC
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
## 2015-01-15 NOTE — Discharge Instructions (Signed)
Contusion °A contusion is a deep bruise. Contusions are the result of a blunt injury to tissues and muscle fibers under the skin. The injury causes bleeding under the skin. The skin overlying the contusion may turn blue, purple, or yellow. Minor injuries will give you a painless contusion, but more severe contusions may stay painful and swollen for a few weeks.  °CAUSES  °This condition is usually caused by a blow, trauma, or direct force to an area of the body. °SYMPTOMS  °Symptoms of this condition include: °· Swelling of the injured area. °· Pain and tenderness in the injured area. °· Discoloration. The area may have redness and then turn blue, purple, or yellow. °DIAGNOSIS  °This condition is diagnosed based on a physical exam and medical history. An X-ray, CT scan, or MRI may be needed to determine if there are any associated injuries, such as broken bones (fractures). °TREATMENT  °Specific treatment for this condition depends on what area of the body was injured. In general, the best treatment for a contusion is resting, icing, applying pressure to (compression), and elevating the injured area. This is often called the RICE strategy. Over-the-counter anti-inflammatory medicines may also be recommended for pain control.  °HOME CARE INSTRUCTIONS  °· Rest the injured area. °· If directed, apply ice to the injured area: °¨ Put ice in a plastic bag. °¨ Place a towel between your skin and the bag. °¨ Leave the ice on for 20 minutes, 2-3 times per day. °· If directed, apply light compression to the injured area using an elastic bandage. Make sure the bandage is not wrapped too tightly. Remove and reapply the bandage as directed by your health care provider. °· If possible, raise (elevate) the injured area above the level of your heart while you are sitting or lying down. °· Take over-the-counter and prescription medicines only as told by your health care provider. °SEEK MEDICAL CARE IF: °· Your symptoms do not  improve after several days of treatment. °· Your symptoms get worse. °· You have difficulty moving the injured area. °SEEK IMMEDIATE MEDICAL CARE IF:  °· You have severe pain. °· You have numbness in a hand or foot. °· Your hand or foot turns pale or cold. °  °This information is not intended to replace advice given to you by your health care provider. Make sure you discuss any questions you have with your health care provider. °  °Document Released: 11/02/2004 Document Revised: 10/14/2014 Document Reviewed: 06/10/2014 °Elsevier Interactive Patient Education ©2016 [employer] ° °Facial or Scalp Contusion °A facial or scalp contusion is a deep bruise on the face or head. Injuries to the face and head generally cause a lot of swelling, especially around the eyes. Contusions are the result of an injury that caused bleeding under the skin. The contusion may turn blue, purple, or yellow. Minor injuries will give you a painless contusion, but more severe contusions may stay painful and swollen for a few weeks.  °CAUSES  °A facial or scalp contusion is caused by a blunt injury or trauma to the face or head area.  °SIGNS AND SYMPTOMS  °· Swelling of the injured area.   °· Discoloration of the injured area.   °· Tenderness, soreness, or pain in the injured area.   °DIAGNOSIS  °The diagnosis can be made by taking a medical history and doing a physical exam. An X-ray exam, CT scan, or MRI may be needed to determine if there are any associated injuries, such as broken bones (fractures). °  TREATMENT  °Often, the best treatment for a facial or scalp contusion is applying cold compresses to the injured area. Over-the-counter medicines may also be recommended for pain control.  °HOME CARE INSTRUCTIONS  °· Only take over-the-counter or prescription medicines as directed by your health care provider.   °· Apply ice to the injured area.   °¨ Put ice in a plastic bag.   °¨ Place a towel between your skin and the bag.   °¨ Leave the  ice on for 20 minutes, 2-3 times a day.   °SEEK MEDICAL CARE IF: °· You have bite problems.   °· You have pain with chewing.   °· You are concerned about facial defects. °SEEK IMMEDIATE MEDICAL CARE IF: °· You have severe pain or a headache that is not relieved by medicine.   °· You have unusual sleepiness, confusion, or personality changes.   °· You throw up (vomit).   °· You have a persistent nosebleed.   °· You have double vision or blurred vision.   °· You have fluid drainage from your nose or ear.   °· You have difficulty walking or using your arms or legs.   °MAKE SURE YOU:  °· Understand these instructions. °· Will watch your condition. °· Will get help right away if you are not doing well or get worse. °  °This information is not intended to replace advice given to you by your health care provider. Make sure you discuss any questions you have with your health care provider. °  °Document Released: 03/02/2004 Document Revised: 02/13/2014 Document Reviewed: 09/05/2012 °Elsevier Interactive Patient Education ©2016 [employer] ° °

## 2015-01-15 NOTE — ED Notes (Signed)
MD at bedside discussing exam findings with pt.  Pt is not happy that she did not have any x-rays done.  MD came to room to discuss the situation with her.  MD offered her x-rays  but pt is now refusing any x-rays .  Sts she wants her papers and she will "know not to come back here any more."   MD discussed with pt her dispo plan and expected course over the next few days as well as plans to be out of work this weekend.
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
## 2015-01-16 MED ORDER — IBUPROFEN 800 MG PO TABS
800.0000 mg | ORAL_TABLET | Freq: Once | ORAL | Status: AC
  Administered 2015-01-16: 800 mg via ORAL
  Filled 2015-01-16: qty 1

## 2016-10-04 ENCOUNTER — Encounter (HOSPITAL_COMMUNITY): Payer: Self-pay | Admitting: *Deleted

## 2016-10-04 ENCOUNTER — Inpatient Hospital Stay (HOSPITAL_COMMUNITY)
Admission: AD | Admit: 2016-10-04 | Discharge: 2016-10-04 | Disposition: A | Discharge status: Home / Self Care | Payer: Self-pay | Source: Ambulatory Visit | Attending: Family Medicine | Admitting: Family Medicine

## 2016-10-04 DIAGNOSIS — R109 Unspecified abdominal pain: Secondary | ICD-10-CM | POA: Insufficient documentation

## 2016-10-04 DIAGNOSIS — R110 Nausea: Secondary | ICD-10-CM | POA: Insufficient documentation

## 2016-10-04 DIAGNOSIS — R350 Frequency of micturition: Secondary | ICD-10-CM | POA: Insufficient documentation

## 2016-10-04 DIAGNOSIS — N912 Amenorrhea, unspecified: Secondary | ICD-10-CM | POA: Insufficient documentation

## 2016-10-04 DIAGNOSIS — M545 Low back pain: Secondary | ICD-10-CM | POA: Insufficient documentation

## 2016-10-04 DIAGNOSIS — Z885 Allergy status to narcotic agent status: Secondary | ICD-10-CM | POA: Insufficient documentation

## 2016-10-04 LAB — URINALYSIS, ROUTINE W REFLEX MICROSCOPIC
Bacteria, UA: NONE SEEN
Bilirubin Urine: NEGATIVE
Glucose, UA: NEGATIVE mg/dL
Hgb urine dipstick: NEGATIVE
KETONES UR: NEGATIVE mg/dL
NITRITE: NEGATIVE
Protein, ur: NEGATIVE mg/dL
Specific Gravity, Urine: 1.024 (ref 1.005–1.030)
pH: 5 (ref 5.0–8.0)

## 2016-10-04 LAB — COMPREHENSIVE METABOLIC PANEL
ALT: 14 U/L (ref 14–54)
AST: 17 U/L (ref 15–41)
Albumin: 4 g/dL (ref 3.5–5.0)
Alkaline Phosphatase: 98 U/L (ref 38–126)
Anion gap: 8 (ref 5–15)
BILIRUBIN TOTAL: 0.2 mg/dL — AB (ref 0.3–1.2)
BUN: 10 mg/dL (ref 6–20)
CHLORIDE: 102 mmol/L (ref 101–111)
CO2: 25 mmol/L (ref 22–32)
Calcium: 8.6 mg/dL — ABNORMAL LOW (ref 8.9–10.3)
Creatinine, Ser: 0.71 mg/dL (ref 0.44–1.00)
Glucose, Bld: 89 mg/dL (ref 65–99)
Potassium: 3.5 mmol/L (ref 3.5–5.1)
Sodium: 135 mmol/L (ref 135–145)
Total Protein: 7.5 g/dL (ref 6.5–8.1)

## 2016-10-04 LAB — CBC WITH DIFFERENTIAL/PLATELET
BASOS ABS: 0.1 10*3/uL (ref 0.0–0.1)
BASOS PCT: 2 %
EOS ABS: 1.6 10*3/uL — AB (ref 0.0–0.7)
Eosinophils Relative: 32 %
HCT: 30.1 % — ABNORMAL LOW (ref 36.0–46.0)
HEMOGLOBIN: 9.2 g/dL — AB (ref 12.0–15.0)
Lymphocytes Relative: 33 %
Lymphs Abs: 1.6 10*3/uL (ref 0.7–4.0)
MCH: 20.4 pg — ABNORMAL LOW (ref 26.0–34.0)
MCHC: 30.6 g/dL (ref 30.0–36.0)
MCV: 66.6 fL — ABNORMAL LOW (ref 78.0–100.0)
Monocytes Absolute: 0.3 10*3/uL (ref 0.1–1.0)
Monocytes Relative: 6 %
Neutro Abs: 1.4 10*3/uL — ABNORMAL LOW (ref 1.7–7.7)
Neutrophils Relative %: 27 %
Platelets: 312 10*3/uL (ref 150–400)
RBC: 4.52 MIL/uL (ref 3.87–5.11)
RDW: 16.3 % — ABNORMAL HIGH (ref 11.5–15.5)
WBC: 5 10*3/uL (ref 4.0–10.5)

## 2016-10-04 LAB — WET PREP, GENITAL
Clue Cells Wet Prep HPF POC: NONE SEEN
SPERM: NONE SEEN
TRICH WET PREP: NONE SEEN
YEAST WET PREP: NONE SEEN

## 2016-10-04 LAB — POCT PREGNANCY, URINE: Preg Test, Ur: NEGATIVE

## 2016-10-04 LAB — HCG, SERUM, QUALITATIVE: PREG SERUM: NEGATIVE

## 2016-10-04 NOTE — MAU Note (Signed)
Pt hasn't had a period in two months, is not on birth control.  Having severe lower abd & back cramps for the last 3 weeks, having breast pain.  Denies bleeding.  Hasn't done HPT - "I'm terrified to do a pregnancy test."

## 2016-10-04 NOTE — MAU Provider Note (Signed)
HPI: Tina Lambert is a 31 year old female that presents with the complaint of missing her period. Pt states last normal period was in July and she had an episode of spotting early in August but no period yet. Accompanying symptoms includes nausea, abdominal pain, lower back pain with urinary frequency. Pt has not taken an at home pregnancy test, but her grandmother performed some "old time pregnancy tests and told her she was pregnant".  Marland Kitchen..Review of Systems  Respiratory: Positive for shortness of breath.   Cardiovascular: Negative for leg swelling.  Gastrointestinal: Positive for abdominal pain, constipation, nausea and vomiting.  Genitourinary: Positive for frequency. Negative for dysuria, hematuria and urgency.  Neurological: Positive for headaches.    Marland Kitchen..Physical Exam  Constitutional: She is well-developed, well-nourished, and in no distress.  HENT:  Head: Normocephalic and atraumatic.  Cardiovascular: Normal rate, regular rhythm and normal heart sounds.   Pulmonary/Chest: Effort normal. She has decreased breath sounds in the right lower field and the left lower field.  Abdominal: Soft. Bowel sounds are normal. There is tenderness in the right lower quadrant and epigastric area.    [name], [name], AGNP 10/04/2016, 1:06 PM   I confirm that I have verified the information documented in the nurse practitioner student's note and that I have also personally reperformed the physical exam and all medical decision making activities.  [name], CNM 10/04/2016, 4:57 PM

## 2016-10-04 NOTE — MAU Note (Signed)
Pt presents with c/o having "abnormal" period this month.  Reports had a period earlier this month that lasted 1 day.  Pt also reports abdominal "menstrual" like cramps.

## 2016-10-04 NOTE — MAU Provider Note (Signed)
History     CSN: [id number]  Arrival date and time: 10/04/16 1133   Provider's Initial Contact with patient @ 1315; AGNP student initial    Chief Complaint  Patient presents with  . Abdominal Pain  . Back Pain   HPI  Tina Lambert is a 31 yo [phone] non-pregnant female presenting to MAU with complaints of missed period, "abnormal period this month", and sore breasts.  She reports her LMP was 1st week of July and an episode of spotting for "a few hours" 8/5.  She also complains of nausea, abdominal pain, lower back pain with urinary frequency.  She has not taken a HPT, because she is "terrified to take a HPT".  She states that her grandmother performed an "old time pregnancy tests and she told her that ws pregnant".    Past Medical History:  Diagnosis Date  . Abnormal Pap smear   . Anemia   . Eczema   . Enlarged thyroid   . Genital herpes   . Hx gestational diabetes    with 2nd pregnancy  . Infection    UTI  . Obesity   . Preterm labor   . Prior pregnancy with fetal demise    21 weeks; 1st pregnancy    Past Surgical History:  Procedure Laterality Date  . TONSILLECTOMY    . WISDOM TOOTH EXTRACTION      Family History  Problem Relation Age of Onset  . Cancer Paternal Uncle        1 prostate, 1 colon  . Hypertension Mother   . Hypertension Father   . Hypertension Maternal Grandmother   . Other Neg Hx     Social History  Substance Use Topics  . Smoking status: Never Smoker  . Smokeless tobacco: Never Used  . Alcohol use No    Allergies:  Allergies  Allergen Reactions  . Percocet [Oxycodone-Acetaminophen] Nausea And Vomiting    No prescriptions prior to admission.    Review of Systems  Constitutional: Negative.   HENT: Negative.   Eyes: Negative.   Respiratory: Positive for shortness of breath.   Cardiovascular: Negative.   Gastrointestinal: Negative.   Endocrine: Negative.   Genitourinary: Positive for pelvic pain.  Musculoskeletal:  Negative.   Skin: Negative.   Allergic/Immunologic: Negative.   Neurological: Negative.   Hematological: Negative.   Psychiatric/Behavioral: Negative.    Physical Exam   Blood pressure (!) 146/93, pulse 75, temperature 97.8 F (36.6 C), temperature source Oral, resp. rate 18, height 5\' 3"  (1.6 m), weight 86.6 kg (191 lb), last menstrual period 08/17/2016.  Patient Vitals for the past 24 hrs:  BP Temp Temp src Pulse Resp SpO2 Height Weight  10/04/16 1432 (!) 142/95 98.6 F (37 C) Oral 64 20 100 % - -  10/04/16 1142 (!) 146/93 97.8 F (36.6 C) Oral 75 18 - 5\' 3"  (1.6 m) 86.6 kg (191 lb)    Physical Exam  Constitutional: She is oriented to person, place, and time. She appears well-developed and well-nourished.  HENT:  Head: Normocephalic.  Eyes: Pupils are equal, round, and reactive to light.  Neck: Normal range of motion.  Cardiovascular: Normal rate, regular rhythm and normal heart sounds.   Respiratory: Effort normal and breath sounds normal. Right breast exhibits no inverted nipple, no mass, no nipple discharge, no skin change and no tenderness. Left breast exhibits no inverted nipple, no mass, no nipple discharge, no skin change and no tenderness. Breasts are symmetrical.  No tenderness elicited with breast exam  GI: Soft. Bowel sounds are normal.  Musculoskeletal: Normal range of motion.  Neurological: She is alert and oriented to person, place, and time. She has normal reflexes.  Skin: Skin is warm and dry.  Psychiatric: She has a normal mood and affect. Her behavior is normal. Judgment and thought content normal.    MAU Course  Procedures  MDM CCUA UCx UPT HCG Qualitative CBC with Diff CMP Wet Prep GC/CT HIV  Results for orders placed or performed during the hospital encounter of 10/04/16 (from the past 24 hour(s))  Urinalysis, Routine w reflex microscopic     Status: Abnormal   Collection Time: 10/04/16 11:47 AM  Result Value Ref Range   Color, Urine YELLOW  YELLOW   APPearance CLOUDY (A) CLEAR   Specific Gravity, Urine 1.024 1.005 - 1.030   pH 5.0 5.0 - 8.0   Glucose, UA NEGATIVE NEGATIVE mg/dL   Hgb urine dipstick NEGATIVE NEGATIVE   Bilirubin Urine NEGATIVE NEGATIVE   Ketones, ur NEGATIVE NEGATIVE mg/dL   Protein, ur NEGATIVE NEGATIVE mg/dL   Nitrite NEGATIVE NEGATIVE   Leukocytes, UA LARGE (A) NEGATIVE   RBC / HPF 0-5 0 - 5 RBC/hpf   WBC, UA 6-30 0 - 5 WBC/hpf   Bacteria, UA NONE SEEN NONE SEEN   Squamous Epithelial / LPF 6-30 (A) NONE SEEN   Mucus PRESENT    Hyaline Casts, UA PRESENT   Pregnancy, urine POC     Status: None   Collection Time: 10/04/16 12:03 PM  Result Value Ref Range   Preg Test, Ur NEGATIVE NEGATIVE  Wet prep, genital     Status: Abnormal   Collection Time: 10/04/16  1:20 PM  Result Value Ref Range   Yeast Wet Prep HPF POC NONE SEEN NONE SEEN   Trich, Wet Prep NONE SEEN NONE SEEN   Clue Cells Wet Prep HPF POC NONE SEEN NONE SEEN   WBC, Wet Prep HPF POC MODERATE (A) NONE SEEN   Sperm NONE SEEN   Comprehensive metabolic panel     Status: Abnormal   Collection Time: 10/04/16  1:31 PM  Result Value Ref Range   Sodium 135 135 - 145 mmol/L   Potassium 3.5 3.5 - 5.1 mmol/L   Chloride 102 101 - 111 mmol/L   CO2 25 22 - 32 mmol/L   Glucose, Bld 89 65 - 99 mg/dL   BUN 10 6 - 20 mg/dL   Creatinine, Ser 1.61 0.44 - 1.00 mg/dL   Calcium 8.6 (L) 8.9 - 10.3 mg/dL   Total Protein 7.5 6.5 - 8.1 g/dL   Albumin 4.0 3.5 - 5.0 g/dL   AST 17 15 - 41 U/L   ALT 14 14 - 54 U/L   Alkaline Phosphatase 98 38 - 126 U/L   Total Bilirubin 0.2 (L) 0.3 - 1.2 mg/dL   GFR calc non Af Amer >60 >60 mL/min   GFR calc Af Amer >60 >60 mL/min   Anion gap 8 5 - 15  hCG, serum, qualitative     Status: None   Collection Time: 10/04/16  1:31 PM  Result Value Ref Range   Preg, Serum NEGATIVE NEGATIVE  CBC with Differential/Platelet     Status: Abnormal   Collection Time: 10/04/16  1:55 PM  Result Value Ref Range   WBC 5.0 4.0 - 10.5 K/uL    RBC 4.52 3.87 - 5.11 MIL/uL   Hemoglobin 9.2 (L) 12.0 - 15.0 g/dL   HCT 09.6 (L) 04.5 - 40.9 %  MCV 66.6 (L) 78.0 - 100.0 fL   MCH 20.4 (L) 26.0 - 34.0 pg   MCHC 30.6 30.0 - 36.0 g/dL   RDW 69.616.3 (H) 29.511.5 - 28.415.5 %   Platelets 312 150 - 400 K/uL   Neutrophils Relative % 27 %   Neutro Abs 1.4 (L) 1.7 - 7.7 K/uL   Lymphocytes Relative 33 %   Lymphs Abs 1.6 0.7 - 4.0 K/uL   Monocytes Relative 6 %   Monocytes Absolute 0.3 0.1 - 1.0 K/uL   Eosinophils Relative 32 %   Eosinophils Absolute 1.6 (H) 0.0 - 0.7 K/uL   Basophils Relative 2 %   Basophils Absolute 0.1 0.0 - 0.1 K/uL    Assessment and Plan  Amenorrhea - Offered Provera challenge -- patient declined - Option #2: Advised to wait until September for period to start - [id number], information given - Instructions on secondary amenorrhea given  Discharge home Patient verbalized an understanding of the plan of care and agrees.   Raelyn Moraolitta Baker Kogler, MSN, CNM 10/04/2016, 2:25 PM

## 2016-10-05 LAB — GC/CHLAMYDIA PROBE AMP (~~LOC~~) NOT AT ARMC
CHLAMYDIA, DNA PROBE: NEGATIVE
NEISSERIA GONORRHEA: NEGATIVE

## 2016-10-05 LAB — URINE CULTURE: SPECIAL REQUESTS: NORMAL

## 2016-10-05 LAB — HIV ANTIBODY (ROUTINE TESTING W REFLEX): HIV SCREEN 4TH GENERATION: NONREACTIVE

## 2016-10-31 ENCOUNTER — Ambulatory Visit: Payer: Self-pay | Admitting: Obstetrics & Gynecology

## 2016-10-31 ENCOUNTER — Encounter: Payer: Self-pay | Admitting: *Deleted

## 2016-10-31 NOTE — Progress Notes (Signed)
[place] did not keep a scheduled appointment for gyn appt. Per Dr. Earlene Plater no need to call patient , may reschedule if she calls.

## 2017-04-23 ENCOUNTER — Other Ambulatory Visit: Payer: Self-pay

## 2017-04-23 ENCOUNTER — Encounter (HOSPITAL_COMMUNITY): Payer: Self-pay | Admitting: *Deleted

## 2017-04-24 ENCOUNTER — Other Ambulatory Visit: Payer: Self-pay | Admitting: Obstetrics and Gynecology

## 2017-05-10 ENCOUNTER — Other Ambulatory Visit (HOSPITAL_COMMUNITY): Payer: Self-pay | Admitting: Obstetrics & Gynecology

## 2017-05-10 DIAGNOSIS — Z3A13 13 weeks gestation of pregnancy: Secondary | ICD-10-CM

## 2017-05-11 ENCOUNTER — Other Ambulatory Visit: Payer: Self-pay

## 2017-05-11 ENCOUNTER — Ambulatory Visit (HOSPITAL_COMMUNITY): Payer: Medicaid Other | Admitting: Anesthesiology

## 2017-05-11 ENCOUNTER — Ambulatory Visit (HOSPITAL_COMMUNITY)
Admission: RE | Admit: 2017-05-11 | Discharge: 2017-05-11 | Disposition: A | Discharge status: Home / Self Care | Payer: Medicaid Other | Source: Ambulatory Visit | Attending: Obstetrics and Gynecology | Admitting: Obstetrics and Gynecology

## 2017-05-11 ENCOUNTER — Encounter (HOSPITAL_COMMUNITY): Payer: Self-pay

## 2017-05-11 ENCOUNTER — Encounter (HOSPITAL_COMMUNITY)
Admission: RE | Disposition: A | Discharge status: Home / Self Care | Payer: Self-pay | Source: Ambulatory Visit | Attending: Obstetrics and Gynecology

## 2017-05-11 DIAGNOSIS — O98312 Other infections with a predominantly sexual mode of transmission complicating pregnancy, second trimester: Secondary | ICD-10-CM | POA: Diagnosis not present

## 2017-05-11 DIAGNOSIS — O09212 Supervision of pregnancy with history of pre-term labor, second trimester: Secondary | ICD-10-CM | POA: Insufficient documentation

## 2017-05-11 DIAGNOSIS — A6000 Herpesviral infection of urogenital system, unspecified: Secondary | ICD-10-CM | POA: Diagnosis not present

## 2017-05-11 DIAGNOSIS — E669 Obesity, unspecified: Secondary | ICD-10-CM | POA: Insufficient documentation

## 2017-05-11 DIAGNOSIS — O3432 Maternal care for cervical incompetence, second trimester: Secondary | ICD-10-CM | POA: Diagnosis present

## 2017-05-11 DIAGNOSIS — Z79899 Other long term (current) drug therapy: Secondary | ICD-10-CM | POA: Insufficient documentation

## 2017-05-11 DIAGNOSIS — N883 Incompetence of cervix uteri: Secondary | ICD-10-CM

## 2017-05-11 DIAGNOSIS — O99212 Obesity complicating pregnancy, second trimester: Secondary | ICD-10-CM | POA: Insufficient documentation

## 2017-05-11 DIAGNOSIS — Z3A14 14 weeks gestation of pregnancy: Secondary | ICD-10-CM | POA: Diagnosis not present

## 2017-05-11 DIAGNOSIS — Z885 Allergy status to narcotic agent status: Secondary | ICD-10-CM | POA: Insufficient documentation

## 2017-05-11 HISTORY — PX: CERVICAL CERCLAGE: SHX1329

## 2017-05-11 LAB — BASIC METABOLIC PANEL
ANION GAP: 9 (ref 5–15)
BUN: 6 mg/dL (ref 6–20)
CALCIUM: 8.4 mg/dL — AB (ref 8.9–10.3)
CO2: 21 mmol/L — ABNORMAL LOW (ref 22–32)
Chloride: 103 mmol/L (ref 101–111)
Creatinine, Ser: 0.48 mg/dL (ref 0.44–1.00)
GFR calc Af Amer: >60 mL/min (ref >60)
GFR calc non Af Amer: >60 mL/min (ref >60)
GLUCOSE: 90 mg/dL (ref 65–99)
POTASSIUM: 3.5 mmol/L (ref 3.5–5.1)
Sodium: 133 mmol/L — ABNORMAL LOW (ref 135–145)

## 2017-05-11 LAB — CBC
HEMATOCRIT: 29.2 % — AB (ref 36.0–46.0)
HEMOGLOBIN: 9.1 g/dL — AB (ref 12.0–15.0)
MCH: 20.9 pg — ABNORMAL LOW (ref 26.0–34.0)
MCHC: 31.2 g/dL (ref 30.0–36.0)
MCV: 67.1 fL — ABNORMAL LOW (ref 78.0–100.0)
Platelets: 307 10*3/uL (ref 150–400)
RBC: 4.35 MIL/uL (ref 3.87–5.11)
RDW: 18.7 % — ABNORMAL HIGH (ref 11.5–15.5)
WBC: 7.6 10*3/uL (ref 4.0–10.5)

## 2017-05-11 SURGERY — CERCLAGE, CERVIX, VAGINAL APPROACH
Anesthesia: Spinal

## 2017-05-11 MED ORDER — IBUPROFEN 100 MG/5ML PO SUSP
200.0000 mg | Freq: Four times a day (QID) | ORAL | Status: DC | PRN
  Filled 2017-05-11: qty 20

## 2017-05-11 MED ORDER — LACTATED RINGERS IV SOLN
INTRAVENOUS | Status: DC
  Administered 2017-05-11: 15:00:00 via INTRAVENOUS
  Administered 2017-05-11: 125 mL/h via INTRAVENOUS

## 2017-05-11 MED ORDER — FENTANYL CITRATE (PF) 100 MCG/2ML IJ SOLN: 25.0000 ug | INTRAMUSCULAR | Status: DC | PRN

## 2017-05-11 MED ORDER — BUPIVACAINE IN DEXTROSE 0.75-8.25 % IT SOLN
INTRATHECAL | Status: DC | PRN
  Administered 2017-05-11: 1.2 mL via INTRATHECAL

## 2017-05-11 MED ORDER — KETOROLAC TROMETHAMINE 30 MG/ML IJ SOLN: 30.0000 mg | Freq: Once | INTRAMUSCULAR | Status: DC | PRN

## 2017-05-11 MED ORDER — MEPERIDINE HCL 25 MG/ML IJ SOLN: 6.2500 mg | INTRAMUSCULAR | Status: DC | PRN

## 2017-05-11 MED ORDER — IBUPROFEN 200 MG PO TABS
200.0000 mg | ORAL_TABLET | Freq: Four times a day (QID) | ORAL | Status: DC | PRN
  Filled 2017-05-11: qty 2

## 2017-05-11 MED ORDER — SODIUM CHLORIDE 0.9 % IJ SOLN
Freq: Once | INTRAMUSCULAR | Status: DC
  Filled 2017-05-11: qty 1

## 2017-05-11 MED ORDER — ONDANSETRON HCL 4 MG/2ML IJ SOLN: 4.0000 mg | Freq: Once | INTRAMUSCULAR | Status: DC | PRN

## 2017-05-11 MED ORDER — SODIUM CHLORIDE 0.9 % IJ SOLN
INTRAMUSCULAR | Status: DC | PRN
  Administered 2017-05-11: 30 mL via VAGINAL

## 2017-05-11 SURGICAL SUPPLY — 19 items
CANISTER SUCT 3000ML PPV (MISCELLANEOUS) ×3 IMPLANT
GLOVE BIO SURGEON STRL SZ7.5 (GLOVE) ×3 IMPLANT
GLOVE BIOGEL PI IND STRL 7.0 (GLOVE) ×1 IMPLANT
GLOVE BIOGEL PI IND STRL 7.5 (GLOVE) ×1 IMPLANT
GLOVE BIOGEL PI INDICATOR 7.0 (GLOVE) ×2
GLOVE BIOGEL PI INDICATOR 7.5 (GLOVE) ×2
GOWN STRL REUS W/TWL LRG LVL3 (GOWN DISPOSABLE) ×6 IMPLANT
NS IRRIG 1000ML POUR BTL (IV SOLUTION) ×3 IMPLANT
PACK VAGINAL MINOR WOMEN LF (CUSTOM PROCEDURE TRAY) ×3 IMPLANT
PAD OB MATERNITY 4.3X12.25 (PERSONAL CARE ITEMS) ×3 IMPLANT
PAD PREP 24X48 CUFFED NSTRL (MISCELLANEOUS) ×3 IMPLANT
SUT MERSILENE 5MM BP 1 12 (SUTURE) ×3 IMPLANT
SUT PROLENE 1 CT 1 30 (SUTURE) ×3 IMPLANT
SYR BULB IRRIGATION 50ML (SYRINGE) ×3 IMPLANT
TOWEL OR 17X24 6PK STRL BLUE (TOWEL DISPOSABLE) ×3 IMPLANT
TRAY FOLEY CATH SILVER 14FR (SET/KITS/TRAYS/PACK) ×3 IMPLANT
TUBING NON-CON 1/4 X 20 CONN (TUBING) ×1 IMPLANT
TUBING NON-CON 1/4 X 20' CONN (TUBING) ×1
YANKAUER SUCT BULB TIP NO VENT (SUCTIONS) ×2 IMPLANT

## 2017-05-11 NOTE — Discharge Instructions (Signed)
Cervical Cerclage Cervical cerclage is a surgical procedure to correct a cervix that opens up and thins out before pregnancy is at term (cervical insufficiency, also called incompetent cervix). This condition can cause labor to start early (prematurely). This procedure involves using stitches to sew the cervix shut during pregnancy. Your surgeon may use ultrasound equipment to help guide the procedure and monitor your baby. Ultrasound equipment uses sound waves to take images of your cervix and uterus. Your surgeon will assess these images on a monitor in the operating room. Tell a health care provider about:  Any allergies you have, especially any allergies related to prescribed medicine, stitches, or anesthetic medicines.  All medicines you are taking, including vitamins, herbs, eye drops, creams, and over-the-counter medicines. Bring a list of all of your medicines to your appointment.  Your medical history, including prior labor deliveries.  Any problems you or family members have had with anesthetic medicines.  Any blood disorders you have.  Any surgeries you have had, including prior cervical stitching.  Any medical conditions you have.  Whether you are pregnant or may be pregnant. What are the risks? Generally, this is a safe procedure. However, problems may occur, including:  Infection, such as infection of the cervix or amniotic sac.  Vaginal bleeding.  Allergic reactions to medicines.  Damage to other structures or organs, such as tearing (rupture) of membranes or cervical laceration.  Premature contractions including going into early labor and delivery.  Cervical dystocia, which occurs when the cervix is unable to dilate normally during labor.  What happens before the procedure? Staying hydrated Follow instructions from your health care provider about hydration, which may include:  Up to 2 hours before the procedure - you may continue to drink clear liquids, such  as water, clear fruit juice, black coffee, and plain tea.  Eating and drinking restrictions Follow instructions from your health care provider about eating and drinking, which may include:  8 hours before the procedure - stop eating heavy meals or foods such as meat, fried foods, or fatty foods.  6 hours before the procedure - stop eating light meals or foods, such as toast or cereal.  6 hours before the procedure - stop drinking milk or drinks that contain milk.  2 hours before the procedure - stop drinking clear liquids.  Medicines  Ask your health care provider about: ? Changing or stopping your regular medicines. This is especially important if you are taking diabetes medicines or blood thinners. ? Taking medicines such as aspirin and ibuprofen. These medicines can thin your blood. Do not take these medicines before your procedure if your health care provider instructs you not to.  You may be given antibiotic medicine to help prevent infection. General instructions  Do not put on any lotion, deodorant, or perfume.  Remove contact lenses and jewelry.  Ask your health care provider how your surgical site will be marked or identified.  You may have an exam or testing.  You may have a blood or urine sample taken.  Plan to have someone take you home from the hospital or clinic.  If you will be going home right after the procedure, plan to have someone with you for 24 hours. What happens during the procedure?  To reduce your risk of infection: ? Your health care team will wash or sanitize their hands. ? Your skin will be washed with soap.  An IV tube will be inserted into one of your veins.  You may be  given one or more of the following: ? A medicine to help you relax (sedative). ? A medicine to numb the area (local anesthetic). ? A medicine to make you fall asleep (general anesthetic). ? A medicine that is injected into your spine to numb the area below and slightly  above the injection site (spinal anesthetic). ? A medicine that is injected into an area of your body to numb everything below the injection site (regional anesthetic).  A lubricated instrument (speculum) will be inserted into your vagina. The speculum will be widened to open the walls of your vagina so your surgeon can see your cervix.  Your cervix will be grasped and tightly stitched closed (sutured). To do this, your surgeon will stitch a strong band of thread around your cervix, then the thread will be tightened to hold your cervix shut. The procedure may vary among health care providers and hospitals. What happens after the procedure?  Your blood pressure, heart rate, breathing rate, and blood oxygen level will be monitored until the medicines you were given have worn off. You will be monitored for premature contractions.  You may have light bleeding and mild cramping.  You may have to wear compression stockings. These stockings help to prevent blood clots and reduce swelling in your legs.  Do not drive for 24 hours if you received a sedative.  You may be put on bed rest.  You may be given medicine to prevent infection.  You may be given an injection of a hormone (progesterone) to prevent your uterus from tightening (contracting). Summary  Cervical cerclage is a surgical procedure that involves using stitches to sew the cervix shut during pregnancy.  Your blood pressure, heart rate, breathing rate, and blood oxygen level will be monitored until the medicines you were given have worn off. You will be monitored for premature contractions.  You may need to be on bed rest after the procedure.  Plan to have someone take you home from the hospital or clinic. This information is not intended to replace advice given to you by your health care provider. Make sure you discuss any questions you have with your health care provider. Document Released: 01/06/2008 Document Revised: 09/17/2015  Document Reviewed: 09/09/2015 Elsevier Interactive Patient Education  2018 Else[employer] Inc. Pelvic Rest Pelvic rest may be recommended if:  Your placenta is partially or completely covering the opening of your cervix (placenta previa).  There is bleeding between the wall of the uterus and the amniotic sac in the first trimester of pregnancy (subchorionic hemorrhage).  You went into labor too early (preterm labor).  Based on your overall health and the health of your baby, your health care provider will decide if pelvic rest is right for you. How do I rest my pelvis? For as long as told by your health care provider:  Do not have sex, sexual stimulation, or an orgasm.  Do not use tampons. Do not douche. Do not put anything in your vagina.  Do not lift anything that is heavier than 10 lb (4.5 kg).  Avoid activities that take a lot of effort (are strenuous).  Avoid any activity in which your pelvic muscles could become strained.  When should I seek medical care? Seek medical care if you have:  Cramping pain in your lower abdomen.  Vaginal discharge.  A low, dull backache.  Regular contractions.  Uterine tightening.  When should I seek immediate medical care? Seek immediate medical care if:  You have vaginal bleeding and you  are pregnant.  This information is not intended to replace advice given to you by your health care provider. Make sure you discuss any questions you have with your health care provider. Document Released: 05/20/2010 Document Revised: 07/01/2015 Document Reviewed: 07/27/2014 Elsevier Interactive Patient Education  2018 [employer].   Cervical Cerclage Cervical cerclage is a surgical procedure to correct a cervix that opens up and thins out before pregnancy is at term (cervical insufficiency, also called incompetent cervix). This condition can cause labor to start early (prematurely). This procedure involves using stitches to sew the cervix shut during  pregnancy. Your surgeon may use ultrasound equipment to help guide the procedure and monitor your baby. Ultrasound equipment uses sound waves to take images of your cervix and uterus. Your surgeon will assess these images on a monitor in the operating room. Tell a health care provider about:  Any allergies you have, especially any allergies related to prescribed medicine, stitches, or anesthetic medicines.  All medicines you are taking, including vitamins, herbs, eye drops, creams, and over-the-counter medicines. Bring a list of all of your medicines to your appointment.  Your medical history, including prior labor deliveries.  Any problems you or family members have had with anesthetic medicines.  Any blood disorders you have.  Any surgeries you have had, including prior cervical stitching.  Any medical conditions you have.  Whether you are pregnant or may be pregnant. What are the risks? Generally, this is a safe procedure. However, problems may occur, including:  Infection, such as infection of the cervix or amniotic sac.  Vaginal bleeding.  Allergic reactions to medicines.  Damage to other structures or organs, such as tearing (rupture) of membranes or cervical laceration.  Premature contractions including going into early labor and delivery.  Cervical dystocia, which occurs when the cervix is unable to dilate normally during labor.  What happens before the procedure? Staying hydrated Follow instructions from your health care provider about hydration, which may include:  Up to 2 hours before the procedure - you may continue to drink clear liquids, such as water, clear fruit juice, black coffee, and plain tea.  Eating and drinking restrictions Follow instructions from your health care provider about eating and drinking, which may include:  8 hours before the procedure - stop eating heavy meals or foods such as meat, fried foods, or fatty foods.  6 hours before the  procedure - stop eating light meals or foods, such as toast or cereal.  6 hours before the procedure - stop drinking milk or drinks that contain milk.  2 hours before the procedure - stop drinking clear liquids.  Medicines  Ask your health care provider about: ? Changing or stopping your regular medicines. This is especially important if you are taking diabetes medicines or blood thinners. ? Taking medicines such as aspirin and ibuprofen. These medicines can thin your blood. Do not take these medicines before your procedure if your health care provider instructs you not to.  You may be given antibiotic medicine to help prevent infection. General instructions  Do not put on any lotion, deodorant, or perfume.  Remove contact lenses and jewelry.  Ask your health care provider how your surgical site will be marked or identified.  You may have an exam or testing.  You may have a blood or urine sample taken.  Plan to have someone take you home from the hospital or clinic.  If you will be going home right after the procedure, plan to have someone with  you for 24 hours. What happens during the procedure?  To reduce your risk of infection: ? Your health care team will wash or sanitize their hands. ? Your skin will be washed with soap.  An IV tube will be inserted into one of your veins.  You may be given one or more of the following: ? A medicine to help you relax (sedative). ? A medicine to numb the area (local anesthetic). ? A medicine to make you fall asleep (general anesthetic). ? A medicine that is injected into your spine to numb the area below and slightly above the injection site (spinal anesthetic). ? A medicine that is injected into an area of your body to numb everything below the injection site (regional anesthetic).  A lubricated instrument (speculum) will be inserted into your vagina. The speculum will be widened to open the walls of your vagina so your surgeon can see  your cervix.  Your cervix will be grasped and tightly stitched closed (sutured). To do this, your surgeon will stitch a strong band of thread around your cervix, then the thread will be tightened to hold your cervix shut. The procedure may vary among health care providers and hospitals. What happens after the procedure?  Your blood pressure, heart rate, breathing rate, and blood oxygen level will be monitored until the medicines you were given have worn off. You will be monitored for premature contractions.  You may have light bleeding and mild cramping.  You may have to wear compression stockings. These stockings help to prevent blood clots and reduce swelling in your legs.  Do not drive for 24 hours if you received a sedative.  You may be put on bed rest.  You may be given medicine to prevent infection.  You may be given an injection of a hormone (progesterone) to prevent your uterus from tightening (contracting). Summary  Cervical cerclage is a surgical procedure that involves using stitches to sew the cervix shut during pregnancy.  Your blood pressure, heart rate, breathing rate, and blood oxygen level will be monitored until the medicines you were given have worn off. You will be monitored for premature contractions.  You may need to be on bed rest after the procedure.  Plan to have someone take you home from the hospital or clinic. This information is not intended to replace advice given to you by your health care provider. Make sure you discuss any questions you have with your health care provider. Document Released: 01/06/2008 Document Revised: 09/17/2015 Document Reviewed: 09/09/2015 Elsevier Interactive Patient Education  [employer]2018 Elsevier Inc.

## 2017-05-11 NOTE — H&P (Signed)
Tina MarisBrittany Hollister is an 32 y.o. female with a h/o 21wk loss.  Pt is a patient of Dr. Madalyn RobPinn's and was counseled and decided on 17P and cervical cerclage.     Past Medical History:  Diagnosis Date  . Abnormal Pap smear   . Anemia   . Eczema   . Enlarged thyroid   . Genital herpes   . Hx gestational diabetes    with 2nd pregnancy  . Infection    UTI  . Obesity   . Preterm labor   . Prior pregnancy with fetal demise    21 weeks; 1st pregnancy  . SVD (spontaneous vaginal delivery) 2007   x 6 - 1 fetal demise at 21 weeks    Past Surgical History:  Procedure Laterality Date  . TONSILLECTOMY    . WISDOM TOOTH EXTRACTION      Family History  Problem Relation Age of Onset  . Cancer Paternal Uncle        1 prostate, 1 colon  . Hypertension Mother   . Hypertension Father   . Hypertension Maternal Grandmother   . Other Neg Hx     Social History:  reports that she has never smoked. She has never used smokeless tobacco. She reports that she does not drink alcohol or use drugs.  Allergies:  Allergies  Allergen Reactions  . Percocet [Oxycodone-Acetaminophen] Nausea And Vomiting    Medications Prior to Admission  Medication Sig Dispense Refill Last Dose  . flintstones complete (FLINTSTONES) 60 MG chewable tablet Chew 2 tablets by mouth daily.   05/11/2017 at 0630  . IRON PO Take 1 tablet by mouth daily.   05/10/2017 at Unknown time  . valACYclovir (VALTREX) 1000 MG tablet Take 500 mg by mouth 2 (two) times daily.   05/11/2017 at 0630    ROS  Blood pressure 128/83, pulse 89, temperature 98.5 F (36.9 C), temperature source Oral, resp. rate 16, height 5\' 1"  (1.549 m), weight 194 lb (88 kg), SpO2 100 %, unknown if currently breastfeeding. Physical Exam Lungs CTA CV RRR Abd soft, NT Ext no calf tenderness  Results for orders placed or performed during the hospital encounter of 05/11/17 (from the past 24 hour(s))  Basic metabolic panel     Status: Abnormal   Collection Time:  05/11/17  9:45 AM  Result Value Ref Range   Sodium 133 (L) 135 - 145 mmol/L   Potassium 3.5 3.5 - 5.1 mmol/L   Chloride 103 101 - 111 mmol/L   CO2 21 (L) 22 - 32 mmol/L   Glucose, Bld 90 65 - 99 mg/dL   BUN 6 6 - 20 mg/dL   Creatinine, Ser 1.610.48 0.44 - 1.00 mg/dL   Calcium 8.4 (L) 8.9 - 10.3 mg/dL   GFR calc non Af Amer >60 >60 mL/min   GFR calc Af Amer >60 >60 mL/min   Anion gap 9 5 - 15  CBC     Status: Abnormal   Collection Time: 05/11/17  9:45 AM  Result Value Ref Range   WBC 7.6 4.0 - 10.5 K/uL   RBC 4.35 3.87 - 5.11 MIL/uL   Hemoglobin 9.1 (L) 12.0 - 15.0 g/dL   HCT 09.629.2 (L) 04.536.0 - 40.946.0 %   MCV 67.1 (L) 78.0 - 100.0 fL   MCH 20.9 (L) 26.0 - 34.0 pg   MCHC 31.2 30.0 - 36.0 g/dL   RDW 81.118.7 (H) 91.411.5 - 78.215.5 %   Platelets 307 150 - 400 K/uL    No results  found.  Assessment/Plan: P4 at 14wks with h/o 21wk loss wanting to proceed with cervical cerclage and 17P (to start at 16wks).  R/B/A discussed with patient including but not limited to bleeding, infection, injury, ROM with loss of pregnancy.  Pt verbalized understanding and consent signed and witnessed.  Purcell Nails 05/11/2017, 11:21 AM

## 2017-05-11 NOTE — Anesthesia Procedure Notes (Signed)
Spinal  Patient location during procedure: OR Start time: 05/11/2017 11:40 AM End time: 05/11/2017 11:43 AM Staffing Anesthesiologist: Leilani AbleHatchett, Dencil Cayson, MD Performed: anesthesiologist  Preanesthetic Checklist Completed: patient identified, site marked, surgical consent, pre-op evaluation, timeout performed, IV checked, risks and benefits discussed and monitors and equipment checked Spinal Block Patient position: sitting Prep: site prepped and draped and DuraPrep Patient monitoring: continuous pulse ox and blood pressure Approach: midline Location: L3-4 Injection technique: single-shot Needle Needle type: Pencan  Needle gauge: 24 G Needle length: 10 cm Needle insertion depth: 8 cm Assessment Sensory level: T10

## 2017-05-11 NOTE — Anesthesia Preprocedure Evaluation (Signed)
Anesthesia Evaluation  Patient identified by MRN, date of birth, ID band Patient awake    Reviewed: Allergy & Precautions, H&P , Patient's Chart, lab work & pertinent test results  Airway Mallampati: I  TM Distance: >3 FB Neck ROM: full    Dental no notable dental hx. (+) Teeth Intact   Pulmonary neg pulmonary ROS,    Pulmonary exam normal breath sounds clear to auscultation       Cardiovascular Normal cardiovascular exam     Neuro/Psych negative neurological ROS  negative psych ROS   GI/Hepatic negative GI ROS, Neg liver ROS,   Endo/Other  negative endocrine ROS  Renal/GU negative Renal ROS  negative genitourinary   Musculoskeletal negative musculoskeletal ROS (+)   Abdominal (+) + obese,   Peds  Hematology  (+) Blood dyscrasia, anemia ,   Anesthesia Other Findings   Reproductive/Obstetrics (+) Pregnancy                             Anesthesia Physical Anesthesia Plan  ASA: II  Anesthesia Plan: Spinal   Post-op Pain Management:    Induction:   PONV Risk Score and Plan: 2 and Ondansetron and Dexamethasone  Airway Management Planned: Natural Airway and Nasal Cannula  Additional Equipment:   Intra-op Plan:   Post-operative Plan:   Informed Consent: I have reviewed the patients History and Physical, chart, labs and discussed the procedure including the risks, benefits and alternatives for the proposed anesthesia with the patient or authorized representative who has indicated his/her understanding and acceptance.     Plan Discussed with: CRNA and Surgeon  Anesthesia Plan Comments:         Anesthesia Quick Evaluation

## 2017-05-11 NOTE — Anesthesia Postprocedure Evaluation (Signed)
Anesthesia Post Note  Patient: Tina Lambert  Procedure(s) Performed: CERCLAGE CERVICAL (N/A )     Patient location during evaluation: PACU Anesthesia Type: Spinal Level of consciousness: awake Pain management: pain level controlled Vital Signs Assessment: post-procedure vital signs reviewed and stable Respiratory status: spontaneous breathing Cardiovascular status: stable Postop Assessment: no headache, no backache, spinal receding, patient able to bend at knees and no apparent nausea or vomiting Anesthetic complications: no    Last Vitals:  Vitals:   05/11/17 1415 05/11/17 1430  BP: 103/71 113/66  Pulse: (!) 112 (!) 101  Resp: 17 17  Temp:    SpO2: 100% 100%    Last Pain:  Vitals:   05/11/17 1400  TempSrc:   PainSc: 0-No pain   Pain Goal: Patients Stated Pain Goal: 4 (05/11/17 0959)               Reola Buckles JR,JOHN Susann GivensFRANKLIN

## 2017-05-11 NOTE — Op Note (Signed)
Preop Diagnosis: 1.14wks 2.History of Cervical Incompetence with delivery at 21wks  Postop Diagnosis: 1.14wks 2.History of Cervical Incompetence with delivery at 21wks  Procedure: CERCLAGE CERVICAL   Anesthesia: Choice   Anesthesiologist: Leilani AbleHatchett, [place], MD   Attending: Osborn Cohooberts, Natashia Roseman, MD   Assistant: N/a  Findings: Cervix appears 50% effaced.  Closed on exam.  Pathology: N/a  Fluids: 900 cc  UOP: 50 cc  EBL: 10 cc  Complications: None  Procedure:Then patient was taken to the operating room after the risks, benefits and alternatives discussed with the patient and consent signed and witnessed.  The patient was given a spinal per anesthesia and placed in the dorsal lithotomy position.  The patient was prepped and draped in the usual sterile fashion.  A cervical cerclage stitch was placed using Mersilene and the knot was tied anteriorly on the cervix.  Clindamycin douche was performed.  Membranes remained intact.  Sponge, lap and needle count was correct and the patient was transferred to the recovery room in good condition.

## 2017-05-11 NOTE — Transfer of Care (Signed)
Immediate Anesthesia Transfer of Care Note  Patient: Tina Lambert  Procedure(s) Performed: CERCLAGE CERVICAL (N/A )  Patient Location: PACU  Anesthesia Type:Spinal  Level of Consciousness: awake, alert , oriented and patient cooperative  Airway & Oxygen Therapy: Patient Spontanous Breathing  Post-op Assessment: Report given to RN and Post -op Vital signs reviewed and stable  Post vital signs: Reviewed and stable  Last Vitals:  Vitals Value Taken Time  BP    Temp    Pulse 80 05/11/2017 12:24 PM  Resp 12 05/11/2017 12:24 PM  SpO2 100 % 05/11/2017 12:24 PM  Vitals shown include unvalidated device data.  Last Pain:  Vitals:   05/11/17 0959  TempSrc: Oral      Patients Stated Pain Goal: 4 (05/11/17 0959)  Complications: No apparent anesthesia complications

## 2017-05-12 ENCOUNTER — Encounter (HOSPITAL_COMMUNITY): Payer: Self-pay | Admitting: Obstetrics and Gynecology

## 2017-05-18 ENCOUNTER — Ambulatory Visit (HOSPITAL_COMMUNITY)
Admission: RE | Admit: 2017-05-18 | Discharge: 2017-05-18 | Disposition: A | Discharge status: Home / Self Care | Payer: Medicaid Other | Source: Ambulatory Visit | Attending: Obstetrics & Gynecology | Admitting: Obstetrics & Gynecology

## 2017-05-18 ENCOUNTER — Other Ambulatory Visit (HOSPITAL_COMMUNITY): Payer: Self-pay | Admitting: Obstetrics & Gynecology

## 2017-05-18 DIAGNOSIS — O3432 Maternal care for cervical incompetence, second trimester: Secondary | ICD-10-CM | POA: Diagnosis not present

## 2017-05-18 DIAGNOSIS — Z3686 Encounter for antenatal screening for cervical length: Secondary | ICD-10-CM

## 2017-05-18 DIAGNOSIS — Z3A13 13 weeks gestation of pregnancy: Secondary | ICD-10-CM

## 2017-05-18 DIAGNOSIS — O30042 Twin pregnancy, dichorionic/diamniotic, second trimester: Secondary | ICD-10-CM | POA: Insufficient documentation

## 2017-05-18 DIAGNOSIS — Z3A15 15 weeks gestation of pregnancy: Secondary | ICD-10-CM | POA: Insufficient documentation

## 2017-06-25 ENCOUNTER — Encounter (HOSPITAL_COMMUNITY): Payer: Self-pay

## 2017-06-29 ENCOUNTER — Encounter (HOSPITAL_COMMUNITY): Payer: Self-pay | Admitting: *Deleted

## 2017-07-03 ENCOUNTER — Ambulatory Visit (HOSPITAL_COMMUNITY)
Admission: RE | Admit: 2017-07-03 | Discharge: 2017-07-03 | Disposition: A | Discharge status: Home / Self Care | Payer: Medicaid Other | Source: Ambulatory Visit | Attending: Obstetrics & Gynecology | Admitting: Obstetrics & Gynecology

## 2017-07-03 ENCOUNTER — Other Ambulatory Visit (HOSPITAL_COMMUNITY): Payer: Self-pay | Admitting: Obstetrics & Gynecology

## 2017-07-03 ENCOUNTER — Encounter (HOSPITAL_COMMUNITY): Payer: Self-pay

## 2017-07-03 DIAGNOSIS — Z3A21 21 weeks gestation of pregnancy: Secondary | ICD-10-CM | POA: Insufficient documentation

## 2017-07-03 DIAGNOSIS — Z363 Encounter for antenatal screening for malformations: Secondary | ICD-10-CM

## 2017-07-03 DIAGNOSIS — O99212 Obesity complicating pregnancy, second trimester: Secondary | ICD-10-CM | POA: Diagnosis not present

## 2017-07-03 DIAGNOSIS — O3432 Maternal care for cervical incompetence, second trimester: Secondary | ICD-10-CM | POA: Diagnosis not present

## 2017-07-03 DIAGNOSIS — Z3686 Encounter for antenatal screening for cervical length: Secondary | ICD-10-CM

## 2017-07-03 DIAGNOSIS — O30042 Twin pregnancy, dichorionic/diamniotic, second trimester: Secondary | ICD-10-CM | POA: Insufficient documentation

## 2017-07-03 HISTORY — DX: Essential (primary) hypertension: I10

## 2017-07-03 HISTORY — DX: Herpesviral infection, unspecified: B00.9

## 2017-07-05 ENCOUNTER — Other Ambulatory Visit (HOSPITAL_COMMUNITY): Payer: Self-pay | Admitting: *Deleted

## 2017-07-05 DIAGNOSIS — O30049 Twin pregnancy, dichorionic/diamniotic, unspecified trimester: Secondary | ICD-10-CM

## 2017-07-31 ENCOUNTER — Other Ambulatory Visit (HOSPITAL_COMMUNITY): Payer: Self-pay | Admitting: Maternal and Fetal Medicine

## 2017-07-31 ENCOUNTER — Ambulatory Visit (HOSPITAL_COMMUNITY)
Admission: RE | Admit: 2017-07-31 | Discharge: 2017-07-31 | Disposition: A | Discharge status: Home / Self Care | Payer: Medicaid Other | Source: Ambulatory Visit | Attending: Obstetrics & Gynecology | Admitting: Obstetrics & Gynecology

## 2017-07-31 ENCOUNTER — Other Ambulatory Visit (HOSPITAL_COMMUNITY): Payer: Self-pay | Admitting: *Deleted

## 2017-07-31 ENCOUNTER — Encounter (HOSPITAL_COMMUNITY): Payer: Self-pay

## 2017-07-31 DIAGNOSIS — O30042 Twin pregnancy, dichorionic/diamniotic, second trimester: Secondary | ICD-10-CM | POA: Insufficient documentation

## 2017-07-31 DIAGNOSIS — Z8632 Personal history of gestational diabetes: Secondary | ICD-10-CM

## 2017-07-31 DIAGNOSIS — Z3A25 25 weeks gestation of pregnancy: Secondary | ICD-10-CM | POA: Diagnosis not present

## 2017-07-31 DIAGNOSIS — O09212 Supervision of pregnancy with history of pre-term labor, second trimester: Secondary | ICD-10-CM | POA: Diagnosis not present

## 2017-07-31 DIAGNOSIS — O3432 Maternal care for cervical incompetence, second trimester: Secondary | ICD-10-CM | POA: Diagnosis not present

## 2017-07-31 DIAGNOSIS — O30049 Twin pregnancy, dichorionic/diamniotic, unspecified trimester: Secondary | ICD-10-CM

## 2017-07-31 DIAGNOSIS — O09292 Supervision of pregnancy with other poor reproductive or obstetric history, second trimester: Secondary | ICD-10-CM

## 2017-07-31 DIAGNOSIS — O99212 Obesity complicating pregnancy, second trimester: Secondary | ICD-10-CM

## 2017-08-25 DIAGNOSIS — O30043 Twin pregnancy, dichorionic/diamniotic, third trimester: Secondary | ICD-10-CM | POA: Diagnosis not present

## 2017-08-25 DIAGNOSIS — Z3A29 29 weeks gestation of pregnancy: Secondary | ICD-10-CM | POA: Insufficient documentation

## 2017-08-26 ENCOUNTER — Inpatient Hospital Stay (HOSPITAL_BASED_OUTPATIENT_CLINIC_OR_DEPARTMENT_OTHER)
Admission: EM | Admit: 2017-08-26 | Discharge: 2017-08-26 | Disposition: A | Discharge status: Other Acute Inpatient Hospital | Payer: Medicaid Other | Attending: Obstetrics & Gynecology | Admitting: Obstetrics & Gynecology

## 2017-08-26 ENCOUNTER — Other Ambulatory Visit: Payer: Self-pay

## 2017-08-26 ENCOUNTER — Inpatient Hospital Stay (HOSPITAL_COMMUNITY)
Admission: AD | Admit: 2017-08-26 | Discharge: 2017-08-26 | Disposition: A | Discharge status: Home / Self Care | Payer: Medicaid Other | Source: Ambulatory Visit | Attending: Obstetrics & Gynecology | Admitting: Obstetrics & Gynecology

## 2017-08-26 ENCOUNTER — Encounter (HOSPITAL_BASED_OUTPATIENT_CLINIC_OR_DEPARTMENT_OTHER): Payer: Self-pay | Admitting: *Deleted

## 2017-08-26 DIAGNOSIS — R102 Pelvic and perineal pain: Secondary | ICD-10-CM

## 2017-08-26 DIAGNOSIS — Z9889 Other specified postprocedural states: Secondary | ICD-10-CM

## 2017-08-26 DIAGNOSIS — O30043 Twin pregnancy, dichorionic/diamniotic, third trimester: Secondary | ICD-10-CM

## 2017-08-26 DIAGNOSIS — Z3A29 29 weeks gestation of pregnancy: Secondary | ICD-10-CM | POA: Diagnosis not present

## 2017-08-26 HISTORY — DX: Unspecified pre-eclampsia, unspecified trimester: O14.90

## 2017-08-26 LAB — URINALYSIS, ROUTINE W REFLEX MICROSCOPIC
GLUCOSE, UA: NEGATIVE mg/dL
Hgb urine dipstick: NEGATIVE
Ketones, ur: 5 mg/dL — AB
Nitrite: NEGATIVE
PROTEIN: 100 mg/dL — AB
RBC / HPF: >50 RBC/hpf — ABNORMAL HIGH (ref 0–5)
SPECIFIC GRAVITY, URINE: 1.028 (ref 1.005–1.030)
Trans Epithel, UA: 2
WBC, UA: >50 WBC/hpf — ABNORMAL HIGH (ref 0–5)
pH: 5 (ref 5.0–8.0)

## 2017-08-26 LAB — POCT FERN TEST: POCT FERN TEST: NEGATIVE

## 2017-08-26 MED ORDER — METRONIDAZOLE 500 MG PO TABS
2000.0000 mg | ORAL_TABLET | Freq: Once | ORAL | Status: AC
  Administered 2017-08-26: 2000 mg via ORAL
  Filled 2017-08-26: qty 4

## 2017-08-26 MED ORDER — LACTATED RINGERS IV BOLUS
500.0000 mL | Freq: Once | INTRAVENOUS | Status: AC
  Administered 2017-08-26: 500 mL via INTRAVENOUS

## 2017-08-26 MED ORDER — BETAMETHASONE SOD PHOS & ACET 6 (3-3) MG/ML IJ SUSP
12.0000 mg | Freq: Two times a day (BID) | INTRAMUSCULAR | Status: DC
  Administered 2017-08-26: 12 mg via INTRAMUSCULAR
  Filled 2017-08-26: qty 2

## 2017-08-26 MED ORDER — METRONIDAZOLE 500 MG PO TABS: 2000.0000 mg | ORAL_TABLET | Freq: Once | ORAL | 0 refills | Status: AC

## 2017-08-26 MED ORDER — BETAMETHASONE SOD PHOS & ACET 6 (3-3) MG/ML IJ SUSP
12.0000 mg | Freq: Once | INTRAMUSCULAR | Status: AC
  Administered 2017-08-26: 12 mg via INTRAMUSCULAR
  Filled 2017-08-26: qty 2

## 2017-08-26 NOTE — Progress Notes (Signed)
Dr. Charlotta Newtonzan updated on patient.

## 2017-08-26 NOTE — MAU Note (Signed)
Pt here for 2nd BMZ, reports no complaints

## 2017-08-26 NOTE — MAU Note (Signed)
Pt transfered from [facility] with pelvic pressure and contractions. Pt stated she is till feeling contractions. Stated she has had diarrhea and vomiting for the past 3 days.

## 2017-08-26 NOTE — ED Notes (Signed)
Pt is G7P4 and pregnant with twins at this time. Reports increased pressure in her vaginal area x couple weeks-worse past few days. Hx miscarriage at 20 weeks 11 years ago. All pregnancies were early. Placed on Toco monitor and both babies were visualizes by Dr. Judd Lienelo with [facility] Both with heartbeats around 128-132. One RUQ and one LLQ. Spoke with Starwood HotelsJohnette RRRN at [facility]. Report given.

## 2017-08-26 NOTE — ED Provider Notes (Signed)
[facility] EMERGENCY DEPARTMENT Provider Note   CSN: [id number] Arrival date & time: 08/25/17  2349     History   Chief Complaint Chief Complaint  Patient presents with  . Pelvic Pain    high risk pregnancy    HPI Tina Lambert is a 32 y.o. female.  Patient is a 32 year old female G7 P6-0-0-6 at 3629 weeks gestation with twin pregnancy.  She had a cervical cerclage performed due to a history of early labor.  She presents today with complaints of contractions.  She reports increased intensity and frequency of contractions in her lower pelvis.  She was told by her doctor to be checked out.  She denies any bleeding or rush of fluid.  The history is provided by the patient.  Pelvic Pain  Episode frequency: Intermittently. The problem has been gradually worsening. Nothing aggravates the symptoms. Nothing relieves the symptoms.    Past Medical History:  Diagnosis Date  . Abnormal Pap smear   . Anemia   . Eczema   . Enlarged thyroid   . Genital herpes   . Gestational diabetes   . HSV-2 infection   . Hx gestational diabetes    with 2nd pregnancy  . Hypertension   . Infection    UTI  . Obesity   . Preterm labor   . Prior pregnancy with fetal demise    21 weeks; 1st pregnancy  . SVD (spontaneous vaginal delivery) 2007   x 6 - 1 fetal demise at 21 weeks    Patient Active Problem List   Diagnosis Date Noted  . NSVD (normal spontaneous vaginal delivery) 10/03/2013  . Preeclampsia 10/02/2013  . Variable fetal heart rate decelerations, antepartum 09/25/2013    Past Surgical History:  Procedure Laterality Date  . CERVICAL CERCLAGE N/A 05/11/2017   Procedure: CERCLAGE CERVICAL;  Surgeon: Osborn Cohooberts, Angela, MD;  Location: [facility];  Service: Gynecology;  Laterality: N/A;  . TONSILLECTOMY    . WISDOM TOOTH EXTRACTION       OB History    Gravida  6   Para  5   Term  4   Preterm  1   AB  0   Living  4     SAB  0   TAB      Ectopic      Multiple      Live Births  5            Home Medications    Prior to Admission medications   Medication Sig Start Date End Date Taking? Authorizing Provider  flintstones complete (FLINTSTONES) 60 MG chewable tablet Chew 2 tablets by mouth daily.   Yes [provider]  HYDROXYprogesterone Caproate (MAKENA IM) Inject into the muscle.   Yes [provider]  IRON PO Take 1 tablet by mouth daily.   Yes [provider]  valACYclovir (VALTREX) 1000 MG tablet Take 500 mg by mouth 2 (two) times daily.   Yes [provider]    Family History Family History  Problem Relation Age of Onset  . Cancer Paternal Uncle        1 prostate, 1 colon  . Hypertension Mother   . Hypertension Father   . Hypertension Maternal Grandmother   . Other Neg Hx     Social History Social History   Tobacco Use  . Smoking status: Never Smoker  . Smokeless tobacco: Never Used  Substance Use Topics  . Alcohol use: No  . Drug use: No  Allergies   Percocet [oxycodone-acetaminophen]   Review of Systems Review of Systems  Genitourinary: Positive for pelvic pain.  All other systems reviewed and are negative.    Physical Exam Updated Vital Signs BP 105/73 (BP Location: Left Arm)   Pulse 90   Temp 98.2 F (36.8 C) (Oral)   Resp 18   Ht 5\' 2"  (1.575 m)   Wt 90.7 kg (200 lb)   LMP 02/02/2017   SpO2 98%   BMI 36.58 kg/m   Physical Exam  Constitutional: She is oriented to person, place, and time. She appears well-developed and well-nourished. No distress.  HENT:  Head: Normocephalic and atraumatic.  Neck: Normal range of motion. Neck supple.  Cardiovascular: Normal rate and regular rhythm. Exam reveals no gallop and no friction rub.  No murmur heard. Pulmonary/Chest: Effort normal and breath sounds normal. No respiratory distress. She has no wheezes.  Abdominal: Soft. Bowel sounds are normal. There is no tenderness.  Abdomen with gravid uterus.  Musculoskeletal:  Normal range of motion.  Neurological: She is alert and oriented to person, place, and time.  Skin: Skin is warm and dry. She is not diaphoretic.  Nursing note and vitals reviewed.    ED Treatments / Results  Labs (all labs ordered are listed, but only abnormal results are displayed) Labs Reviewed - No data to display  EKG None  Radiology No results found.  Procedures Procedures (including critical care time)  Medications Ordered in ED Medications - No data to display   Initial Impression / Assessment and Plan / ED Course  I have reviewed the triage vital signs and the nursing notes.  Pertinent labs & imaging results that were available during my care of the patient were reviewed by me and considered in my medical decision making (see chart for details).  Patient is a 32 year old G7 at [redacted] weeks gestation with twins, status post cervical cerclage presenting with possible labor.  She reports increased intensity and frequency of pelvic contractions and pain.  Patient is hemodynamically stable.  Ultrasound performed at bedside reveals good fetal movement with 2 fetal heartbeats in the 140 bpm range.  Patient connected to the fetal heart monitor.  OB/GYN has been consulted.  I have spoken with Dr. Vergie Living who agrees to accept the patient in transfer to [facility].  She will be transferred to the MAU.  Final Clinical Impressions(s) / ED Diagnoses   Final diagnoses:  None    ED Discharge Orders    None       Geoffery Lyons, MD 08/26/17 [phone]

## 2017-08-26 NOTE — Progress Notes (Signed)
Tcf: [facility] ED, patient presenting 8252w2d gestation.  G7P4. Presenting with abd pain and pressure. Patient with cerclage. RN states that Dr. Judd Lienelo has spoken with Dr Vergie LivingPickens and patient will be transferred to MAU, awaiting [facility]. EFM monitoring in progress.  Advised that tracing is of one baby and US need readjusting.

## 2017-08-26 NOTE — MAU Provider Note (Addendum)
History     CSN: [id number]  Arrival date and time: 08/25/17 2349 Tina Lambert was sent from [facility] ED at 0230.   None     Chief Complaint  Patient presents with  . Pelvic Pain    high risk pregnancy  . Contractions   HPI Tina Lambert presented to ED with c/o vaginal pain and pressure similar to previous experience with labor and concerned about birth. She was transferred to MAU and arrived here around 0230. She says she has been having pelvic discomfort since her last OB visit on 7/8. It is worse when she is laying down for sleep at night, especially when laying on her side. She says when she lays on her sides, especially if she has her legs propped or supported open, she has vaginal pressure "like the baby is going to fall out". She also has it random times during the day when she is up doing housework or just walking about. She will have to stop and rest and lay down and it takes several hours to go away. This pain/pressure became worse tonight and was keeping her from sleeping so she came for eval. She has a h/o preterm birth with all her births. She says when she saw Dr Mora Appl on the 8th, a TVUS showed cervical shortening <42mm with baby A transverse (has been transverse since last MFM [place] last month).   She denies contractions, but says when she went into labor with her other babies she did not have significant ctx or sx of labor. Birth hx per her: 1st birth 43wks, child not living. Next 4 births were all preterm between 33-35wks at birth and she received progesterone IM in those pregnancies, as in this one, to prevent PTB.  She denies LOF, but says for the past few days she has had increased discharge that is sometimes mucous and sometimes watery. Denies odor. Had sex last month and again a few days ago, but did not finish because husband said he could feel the cerclage - this was new. Says neither of them have been able to feel the cerclage.   Not able to access PN record in [place].   OB History     Gravida  6   Para  5   Term  4   Preterm  1   AB  0   Living  4     SAB  0   TAB      Ectopic      Multiple      Live Births  5           Past Medical History:  Diagnosis Date  . Abnormal Pap smear   . Anemia   . Eczema   . Enlarged thyroid   . Genital herpes   . Gestational diabetes   . HSV-2 infection   . Hx gestational diabetes    with 2nd pregnancy  . Hypertension   . Infection    UTI  . Obesity   . Preeclampsia   . Preterm labor   . Prior pregnancy with fetal demise    21 weeks; 1st pregnancy  . SVD (spontaneous vaginal delivery) 2007   x 6 - 1 fetal demise at 21 weeks    Past Surgical History:  Procedure Laterality Date  . CERVICAL CERCLAGE N/A 05/11/2017   Procedure: CERCLAGE CERVICAL;  Surgeon: Osborn Coho, MD;  Location: [facility];  Service: Gynecology;  Laterality: N/A;  . TONSILLECTOMY    . WISDOM TOOTH EXTRACTION  Family History  Problem Relation Age of Onset  . Cancer Paternal Uncle        1 prostate, 1 colon  . Hypertension Mother   . Hypertension Father   . Hypertension Maternal Grandmother   . Other Neg Hx     Social History   Tobacco Use  . Smoking status: Never Smoker  . Smokeless tobacco: Never Used  Substance Use Topics  . Alcohol use: No  . Drug use: No    Allergies:  Allergies  Allergen Reactions  . Percocet [Oxycodone-Acetaminophen] Nausea And Vomiting    Medications Prior to Admission  Medication Sig Dispense Refill Last Dose  . flintstones complete (FLINTSTONES) 60 MG chewable tablet Chew 2 tablets by mouth daily.   Taking  . HYDROXYprogesterone Caproate (MAKENA IM) Inject into the muscle.   Taking  . IRON PO Take 1 tablet by mouth daily.   Taking  . valACYclovir (VALTREX) 1000 MG tablet Take 500 mg by mouth 2 (two) times daily.   Taking    Review of Systems  Constitutional: Negative for chills and fever.  Gastrointestinal: Negative.   Genitourinary: Positive for pelvic pain, vaginal  discharge and vaginal pain. Negative for difficulty urinating, dyspareunia, dysuria, flank pain and vaginal bleeding.  Psychiatric/Behavioral: Negative.   All other systems reviewed and are negative.  Physical Exam   Blood pressure 123/75, pulse 95, temperature 98.5 F (36.9 C), temperature source Oral, resp. rate 18, height 5\' 2"  (1.575 m), weight 90.7 kg (200 lb), last menstrual period 02/02/2017, SpO2 100 %, unknown if currently breastfeeding.  Physical Exam  Nursing note and vitals reviewed. Constitutional: She is oriented to person, place, and time. She appears well-developed and well-nourished.  HENT:  Head: Normocephalic and atraumatic.  Cardiovascular: Normal rate and regular rhythm.  Respiratory: Effort normal and breath sounds normal. No respiratory distress.  GI: Soft. She exhibits no distension and no mass.  Genitourinary: Rectum normal and uterus normal. Pelvic exam was performed with patient supine. There is no rash or lesion on the right labia. There is no rash or lesion on the left labia. Cervix exhibits discharge (large amount thin, green, frothy; strong foul odor). Vaginal discharge found.  Musculoskeletal: Normal range of motion.  Neurological: She is alert and oriented to person, place, and time.  Skin: Skin is warm and dry.  Psychiatric: She has a normal mood and affect. Her behavior is normal. Thought content normal.  Peripheral CV: no edema BLE, normal 2+ pulses.   VE 0-FT/60/out of pelvis. No obvious presenting part palpated. Cerclage intact.  No pelvic pain along symphysis pubis and outward along pubic rami. FHR normal baseline for both babies, moderate variability x2, accels x2, no decels x2 A few ctx when she arrived on monitor then none noted.    MAU Course  Procedures  SSE Wet mount - +WBC, No clue cells, No yeast, ?+trich (non-mobile) Fern test - negative GCCT - ordered, results pending UA - amber urine, rare bacteria --> culture pending Reactive NST  x2  MDM Consulted with Dr Charlotta Newton Does not appear to be in preterm labor - no significant or regular contractions and no cervical change. Membranes intact. She does have a vaginal infection that will be treated.   Assessment and Plan  1. Pelvic Pain a/w di/di twin pregnancy - no evidence of preterm labor.  2. Trichomoniasis - Dx based on clinical presentation of discharge and ?non-mobile trich on wet mount. rx metronidazole 2g PO once. Partner needs to be treated as well -  rx to pharmacy.  3. Di/Di twins - normal fetal evaluation.  4. History of preterm birth - given her birth history, symptoms today, and risk for preterm birth, Dr Charlotta Newtonzan recommended BMTZ x2 and [place] agrees with this POC. Will get first dose now and return in 12h to get 2nd dose (she does not live in town and will be in town today so this is the best plan).She did not want to stay for additional evaluation time. NO SEX/NOTHING IN VAGINA until after birth or otherwise cleared by Dr Mora ApplPinn or one of her providers.   Faylene MillionKathleen A Nadia Torr 08/26/2017, 4:45 AM

## 2017-08-26 NOTE — Progress Notes (Signed)
K. Page CNM notified of patient transfer to MAU and updated on patient status.

## 2017-08-26 NOTE — Progress Notes (Signed)
Lupita LeashDonna, RN states that patient reports that fetal heart tracings are always exactly the same.

## 2017-08-26 NOTE — ED Notes (Signed)
[facility] here to transport pt. 

## 2017-08-26 NOTE — ED Notes (Signed)
Report called to MAU. Spoke with Olegario MessierKathy, RN. Pt resting quietly on left side. Continues to c/o constant pressure in the low abd/vaginal area.

## 2017-08-26 NOTE — ED Notes (Signed)
[facility]

## 2017-08-26 NOTE — ED Triage Notes (Signed)
Pt states she is [redacted] weeks pregnant with twins. She had a cerclage in April. Reports increased pelvic pain and pressure over the last week. States she is to be induced at 32 weeks. Spoke to her doctor and was advised to have her cervix checked to see if it is still closed

## 2017-08-27 LAB — GC/CHLAMYDIA PROBE AMP (~~LOC~~) NOT AT ARMC
CHLAMYDIA, DNA PROBE: NEGATIVE
NEISSERIA GONORRHEA: NEGATIVE

## 2017-08-28 ENCOUNTER — Other Ambulatory Visit (HOSPITAL_COMMUNITY): Payer: Self-pay | Admitting: Obstetrics and Gynecology

## 2017-08-28 ENCOUNTER — Ambulatory Visit (HOSPITAL_COMMUNITY)
Admission: RE | Admit: 2017-08-28 | Discharge: 2017-08-28 | Disposition: A | Discharge status: Home / Self Care | Payer: Medicaid Other | Source: Ambulatory Visit | Attending: Obstetrics & Gynecology | Admitting: Obstetrics & Gynecology

## 2017-08-28 ENCOUNTER — Encounter (HOSPITAL_COMMUNITY): Payer: Self-pay

## 2017-08-28 ENCOUNTER — Other Ambulatory Visit (HOSPITAL_COMMUNITY): Payer: Self-pay | Admitting: *Deleted

## 2017-08-28 DIAGNOSIS — Z362 Encounter for other antenatal screening follow-up: Secondary | ICD-10-CM

## 2017-08-28 DIAGNOSIS — O322XX2 Maternal care for transverse and oblique lie, fetus 2: Secondary | ICD-10-CM | POA: Insufficient documentation

## 2017-08-28 DIAGNOSIS — O99213 Obesity complicating pregnancy, third trimester: Secondary | ICD-10-CM

## 2017-08-28 DIAGNOSIS — O322XX1 Maternal care for transverse and oblique lie, fetus 1: Secondary | ICD-10-CM | POA: Diagnosis not present

## 2017-08-28 DIAGNOSIS — Z3A29 29 weeks gestation of pregnancy: Secondary | ICD-10-CM

## 2017-08-28 DIAGNOSIS — O30043 Twin pregnancy, dichorionic/diamniotic, third trimester: Secondary | ICD-10-CM | POA: Insufficient documentation

## 2017-08-28 DIAGNOSIS — O3433 Maternal care for cervical incompetence, third trimester: Secondary | ICD-10-CM

## 2017-08-28 DIAGNOSIS — IMO0002 Reserved for concepts with insufficient information to code with codable children: Secondary | ICD-10-CM

## 2017-08-28 DIAGNOSIS — O30049 Twin pregnancy, dichorionic/diamniotic, unspecified trimester: Secondary | ICD-10-CM

## 2017-08-28 DIAGNOSIS — O09213 Supervision of pregnancy with history of pre-term labor, third trimester: Secondary | ICD-10-CM | POA: Insufficient documentation

## 2017-08-28 DIAGNOSIS — O09299 Supervision of pregnancy with other poor reproductive or obstetric history, unspecified trimester: Secondary | ICD-10-CM

## 2017-08-28 DIAGNOSIS — Z0489 Encounter for examination and observation for other specified reasons: Secondary | ICD-10-CM

## 2017-08-28 DIAGNOSIS — E669 Obesity, unspecified: Secondary | ICD-10-CM | POA: Insufficient documentation

## 2017-08-28 LAB — URINE CULTURE: Culture: 1000 — AB

## 2017-09-03 ENCOUNTER — Encounter (HOSPITAL_COMMUNITY): Payer: Self-pay

## 2017-09-03 ENCOUNTER — Inpatient Hospital Stay (HOSPITAL_BASED_OUTPATIENT_CLINIC_OR_DEPARTMENT_OTHER): Payer: Medicaid Other

## 2017-09-03 ENCOUNTER — Other Ambulatory Visit: Payer: Self-pay

## 2017-09-03 ENCOUNTER — Inpatient Hospital Stay (HOSPITAL_COMMUNITY)
Admission: AD | Admit: 2017-09-03 | Discharge: 2017-09-07 | DRG: 832 | Disposition: A | Discharge status: Home / Self Care | Payer: Medicaid Other | Attending: Obstetrics & Gynecology | Admitting: Obstetrics & Gynecology

## 2017-09-03 DIAGNOSIS — O99613 Diseases of the digestive system complicating pregnancy, third trimester: Secondary | ICD-10-CM | POA: Diagnosis present

## 2017-09-03 DIAGNOSIS — Z3A30 30 weeks gestation of pregnancy: Secondary | ICD-10-CM | POA: Diagnosis not present

## 2017-09-03 DIAGNOSIS — O368190 Decreased fetal movements, unspecified trimester, not applicable or unspecified: Secondary | ICD-10-CM | POA: Diagnosis not present

## 2017-09-03 DIAGNOSIS — O98313 Other infections with a predominantly sexual mode of transmission complicating pregnancy, third trimester: Secondary | ICD-10-CM | POA: Diagnosis present

## 2017-09-03 DIAGNOSIS — O99820 Streptococcus B carrier state complicating pregnancy: Secondary | ICD-10-CM | POA: Diagnosis present

## 2017-09-03 DIAGNOSIS — O3433 Maternal care for cervical incompetence, third trimester: Secondary | ICD-10-CM

## 2017-09-03 DIAGNOSIS — O322XX1 Maternal care for transverse and oblique lie, fetus 1: Secondary | ICD-10-CM | POA: Diagnosis present

## 2017-09-03 DIAGNOSIS — O26899 Other specified pregnancy related conditions, unspecified trimester: Secondary | ICD-10-CM

## 2017-09-03 DIAGNOSIS — D649 Anemia, unspecified: Secondary | ICD-10-CM | POA: Diagnosis present

## 2017-09-03 DIAGNOSIS — O99013 Anemia complicating pregnancy, third trimester: Secondary | ICD-10-CM | POA: Diagnosis present

## 2017-09-03 DIAGNOSIS — R1013 Epigastric pain: Secondary | ICD-10-CM

## 2017-09-03 DIAGNOSIS — O09213 Supervision of pregnancy with history of pre-term labor, third trimester: Secondary | ICD-10-CM

## 2017-09-03 DIAGNOSIS — O30043 Twin pregnancy, dichorionic/diamniotic, third trimester: Secondary | ICD-10-CM | POA: Diagnosis present

## 2017-09-03 DIAGNOSIS — K8020 Calculus of gallbladder without cholecystitis without obstruction: Secondary | ICD-10-CM | POA: Diagnosis present

## 2017-09-03 DIAGNOSIS — A6000 Herpesviral infection of urogenital system, unspecified: Secondary | ICD-10-CM | POA: Diagnosis present

## 2017-09-03 LAB — COMPREHENSIVE METABOLIC PANEL
ALK PHOS: 148 U/L — AB (ref 38–126)
ALT: 40 U/L (ref 0–44)
ANION GAP: 10 (ref 5–15)
AST: 77 U/L — ABNORMAL HIGH (ref 15–41)
Albumin: 2.8 g/dL — ABNORMAL LOW (ref 3.5–5.0)
BILIRUBIN TOTAL: 1 mg/dL (ref 0.3–1.2)
BUN: <5 mg/dL — ABNORMAL LOW (ref 6–20)
CALCIUM: 8.5 mg/dL — AB (ref 8.9–10.3)
CO2: 22 mmol/L (ref 22–32)
Chloride: 104 mmol/L (ref 98–111)
Creatinine, Ser: 0.48 mg/dL (ref 0.44–1.00)
GFR calc non Af Amer: >60 mL/min (ref >60)
Glucose, Bld: 91 mg/dL (ref 70–99)
Potassium: 4 mmol/L (ref 3.5–5.1)
SODIUM: 136 mmol/L (ref 135–145)
TOTAL PROTEIN: 6 g/dL — AB (ref 6.5–8.1)

## 2017-09-03 LAB — CBC
HCT: 24.8 % — ABNORMAL LOW (ref 36.0–46.0)
HEMOGLOBIN: 7.7 g/dL — AB (ref 12.0–15.0)
MCH: 21.1 pg — AB (ref 26.0–34.0)
MCHC: 31 g/dL (ref 30.0–36.0)
MCV: 67.9 fL — ABNORMAL LOW (ref 78.0–100.0)
Platelets: 287 10*3/uL (ref 150–400)
RBC: 3.65 MIL/uL — ABNORMAL LOW (ref 3.87–5.11)
RDW: 16.7 % — ABNORMAL HIGH (ref 11.5–15.5)
WBC: 9.4 10*3/uL (ref 4.0–10.5)

## 2017-09-03 LAB — TYPE AND SCREEN
ABO/RH(D): O POS
ANTIBODY SCREEN: NEGATIVE

## 2017-09-03 LAB — AMNISURE RUPTURE OF MEMBRANE (ROM) NOT AT ARMC: AMNISURE: NEGATIVE

## 2017-09-03 LAB — URIC ACID: Uric Acid, Serum: 3.5 mg/dL (ref 2.5–7.1)

## 2017-09-03 MED ORDER — CALCIUM CARBONATE ANTACID 500 MG PO CHEW: 2.0000 | CHEWABLE_TABLET | ORAL | Status: DC | PRN

## 2017-09-03 MED ORDER — PRENATAL MULTIVITAMIN CH
1.0000 | ORAL_TABLET | Freq: Every day | ORAL | Status: DC
  Administered 2017-09-06 – 2017-09-07 (×2): 1 via ORAL
  Filled 2017-09-03 (×3): qty 1

## 2017-09-03 MED ORDER — MAGNESIUM SULFATE BOLUS VIA INFUSION
4.0000 g | Freq: Once | INTRAVENOUS | Status: AC
  Administered 2017-09-03: 4 g via INTRAVENOUS
  Filled 2017-09-03: qty 500

## 2017-09-03 MED ORDER — FENTANYL CITRATE (PF) 100 MCG/2ML IJ SOLN
50.0000 ug | INTRAMUSCULAR | Status: DC | PRN
  Administered 2017-09-03: 50 ug via INTRAVENOUS
  Filled 2017-09-03: qty 2

## 2017-09-03 MED ORDER — LACTATED RINGERS IV SOLN
INTRAVENOUS | Status: DC
  Administered 2017-09-03 – 2017-09-06 (×3): via INTRAVENOUS

## 2017-09-03 MED ORDER — ACETAMINOPHEN 325 MG PO TABS
650.0000 mg | ORAL_TABLET | ORAL | Status: DC | PRN
  Administered 2017-09-04 – 2017-09-05 (×2): 650 mg via ORAL
  Filled 2017-09-03 (×2): qty 2

## 2017-09-03 MED ORDER — MAGNESIUM SULFATE 40 G IN LACTATED RINGERS - SIMPLE
2.0000 g/h | INTRAVENOUS | Status: AC
  Administered 2017-09-03 – 2017-09-04 (×2): 2 g/h via INTRAVENOUS
  Filled 2017-09-03 (×2): qty 40

## 2017-09-03 MED ORDER — ZOLPIDEM TARTRATE 5 MG PO TABS
5.0000 mg | ORAL_TABLET | Freq: Every evening | ORAL | Status: DC | PRN
  Filled 2017-09-03: qty 1

## 2017-09-03 MED ORDER — DOCUSATE SODIUM 100 MG PO CAPS
100.0000 mg | ORAL_CAPSULE | Freq: Every day | ORAL | Status: DC
  Administered 2017-09-05: 100 mg via ORAL
  Filled 2017-09-03 (×3): qty 1

## 2017-09-03 MED ORDER — LACTATED RINGERS IV BOLUS
1000.0000 mL | Freq: Once | INTRAVENOUS | Status: AC
  Administered 2017-09-03: 1000 mL via INTRAVENOUS

## 2017-09-03 NOTE — MAU Note (Signed)
CNM to order beside ultrasound for fetal heart rates and presentation.  Baby A difficult to trace

## 2017-09-03 NOTE — H&P (Signed)
Tina Lambert is a 32 y.o. female presenting for preterm contractions with di/di twins and cerclage.  OB History    Gravida  6   Para  5   Term  4   Preterm  1   AB  0   Living  4     SAB  0   TAB      Ectopic      Multiple      Live Births  5          Past Medical History:  Diagnosis Date  . Abnormal Pap smear   . Anemia   . Eczema   . Enlarged thyroid   . Genital herpes   . Gestational diabetes   . HSV-2 infection   . Hx gestational diabetes    with 2nd pregnancy  . Hypertension   . Infection    UTI  . Obesity   . Preeclampsia   . Preterm labor   . Prior pregnancy with fetal demise    21 weeks; 1st pregnancy  . SVD (spontaneous vaginal delivery) 2007   x 6 - 1 fetal demise at 21 weeks   Past Surgical History:  Procedure Laterality Date  . CERVICAL CERCLAGE N/A 05/11/2017   Procedure: CERCLAGE CERVICAL;  Surgeon: Osborn Coho, MD;  Location: [facility];  Service: Gynecology;  Laterality: N/A;  . TONSILLECTOMY    . WISDOM TOOTH EXTRACTION     Family History: family history includes Cancer in her paternal uncle; Hypertension in her father, maternal grandmother, and mother. Social History:  reports that she has never smoked. She has never used smokeless tobacco. She reports that she does not drink alcohol or use drugs.     Maternal Diabetes: No Genetic Screening: Normal Maternal Ultrasounds/Referrals: Normal Fetal Ultrasounds or other Referrals:  Referred to Materal Fetal Medicine  Maternal Substance Abuse:  No Significant Maternal Medications:  None Significant Maternal Lab Results:  Lab values include: Group B Strep positive, Other: Anemia with Hgb 7.8 Other Comments:  Di/di twins with a history of preterm birth at 49 weeks and fetal demise, cerclage in place.   Review of Systems  Gastrointestinal: Positive for abdominal pain.  Genitourinary: Negative for dysuria, frequency and hematuria.  Patient denies vaginal pruritis, discharge, or  bleeding.   Maternal Medical History:  Reason for admission: Contractions.   Contractions: Onset was 3-5 hours ago.   Frequency: regular.   Perceived severity is strong.    Fetal activity: Perceived fetal activity is normal.   Last perceived fetal movement was within the past hour.    Prenatal complications: Preterm labor.   No bleeding.   Prenatal Complications - Diabetes: none.    Dilation: (P) 1.5 Exam by:: (P) Kathalene Frames CNM  Vitals:   09/03/17 1445  BP: 134/72  Pulse: (!) 115  Resp: 20  Temp: 98.2 F (36.8 C)  TempSrc: Oral  SpO2: 100%  Weight: 90.6 kg (199 lb 12 oz)   No results found for this or any previous visit (from the past 24 hour(s)).  MFM U/S 08/28/17: Impression  Dichorionic-diamniotic twin pregnancy.  Patient had mid-trimester cerclage. She was evaluated in the MAU for suspected preterm labor and received 2 doses of betamethasone.  She does not have uterine contractions now.  Twin A: Inferior, transverse lie, posterior placenta. Fetal  growth is appropriate for the gestational age. Amniotic fluid is normal and good fetal activity is seen.  Twin B: Superior, transverse, anterior placenta. Fetal growth is appropriate for the gestational  age. Amniotic fluid is normal and good fetal activity is seen.  Growth discordancy: 3%.  On clinical evaluation (MAU on 08/25/17), the cerclage was intact. I reassured the patient that transvaginal ultrasound to assess cerclage or cervix is not necessary and clinical  examination provides better information. If cervix is not  dilated, removal of cerclage is not indicated.  Baby A is 3lbs2oz (53%ile) Baby B is 3lbs4oz (57%ile)  Maternal Exam:  Uterine Assessment: Contraction strength is moderate.  Contraction frequency is regular.   Abdomen: Patient reports no abdominal tenderness. Fundal height is Size > dates, twin pregnancy .       Physical Exam  Nursing note and vitals reviewed. Constitutional: She is  oriented to person, place, and time. She appears well-developed and well-nourished.  HENT:  Head: Normocephalic.  Eyes: Pupils are equal, round, and reactive to light.  Cardiovascular: Normal rate, regular rhythm, normal heart sounds and intact distal pulses.  Respiratory: Effort normal and breath sounds normal.  GI: There is no tenderness.  Genitourinary: Vagina normal and uterus normal.  Genitourinary Comments: Cerclage present   Musculoskeletal: Normal range of motion.  Neurological: She is alert and oriented to person, place, and time.  Skin: Skin is warm and dry.  Psychiatric: She has a normal mood and affect. Her behavior is normal. Judgment and thought content normal.     Fetal heart rate: Assessed by ultrasound and placed on the monitor, category 1 FHTs   Prenatal labs: ABO, Rh:  O+ Antibody:  Negative Rubella:  Immune RPR:   NR HBsAg:   NR HIV: Non Reactive (08/29 1331)  GBS:   Positive   Assessment/Plan: [age] y.o. [id number] at 3247w3d Di/di twin boys, both transverse History of preterm birth and demise at 7121 weeks Cerclage in place Betamethasone x2 doses complete 08/26/17 Preterm labor Consulted Dr. Estanislado Pandyivard Admit to observation Magnesium sulfate 4g bolus followed by 2g/hour drip for neuroprotection and tocolysis for 24 hours If contractions cannot be stopped, will proceed with a primary cesarean section  Asymptomatic anemia, will type and screen  Will continue to closely monitor   Janeece RiggersEllis K Shmuel Girgis 09/03/2017, 4:15 PM

## 2017-09-03 NOTE — MAU Note (Signed)
Hurting, having contractions that are hurting really really bad.  Feels hot. No bleeding or leaking.   Pt has a cerclage

## 2017-09-04 LAB — PROTEIN / CREATININE RATIO, URINE: Creatinine, Urine: 37 mg/dL

## 2017-09-04 MED ORDER — ONDANSETRON HCL 4 MG/2ML IJ SOLN
4.0000 mg | Freq: Four times a day (QID) | INTRAMUSCULAR | Status: DC | PRN
Start: 2017-09-04 — End: 2017-09-07
  Administered 2017-09-04 – 2017-09-06 (×2): 4 mg via INTRAVENOUS
  Filled 2017-09-04 (×2): qty 2

## 2017-09-04 NOTE — Progress Notes (Signed)
Tina Lambert is a 32 y.o. female, [phone] patient pregnant at 80w4dwith di/di twin boys.  Subjective: Patient verbalizes she feels a bit better than yesterday but still feels badly. She reports feeling contractions but they are less intense than yesterday and do not come regularly, she feels a tightening sensation but they are less painful. She reports improved, but still present, pelvic pressure and pain. She denies vaginal bleeding or spotting.   Occasional runs of small contractions noted on the monitor but appear markedly improved from her contraction pattern on admission. Patient is calm, pleasant, and able to hold a full conversation; whereas yesterday in the MAU she was writhing on the bed and having difficulty speaking.   1. Decreased fetal movement    Past Medical History:  Diagnosis Date  . Abnormal Pap smear   . Anemia   . Eczema   . Enlarged thyroid   . Genital herpes   . Gestational diabetes   . HSV-2 infection   . Hx gestational diabetes    with 2nd pregnancy  . Hypertension   . Infection    UTI  . Obesity   . Preeclampsia   . Preterm labor   . Prior pregnancy with fetal demise    21 weeks; 1st pregnancy  . SVD (spontaneous vaginal delivery) 2007   x 6 - 1 fetal demise at 21 weeks    Current Facility-Administered Medications  Medication Dose Route Frequency Provider Last Rate Last Dose  . acetaminophen (TYLENOL) tablet 650 mg  650 mg Oral Q4H PRN GMarikay Alar CNM      . calcium carbonate (TUMS - dosed in mg elemental calcium) chewable tablet 400 mg of elemental calcium  2 tablet Oral Q4H PRN GMarikay Alar CNM      . docusate sodium (COLACE) capsule 100 mg  100 mg Oral Daily GMarikay Alar CNM      . fentaNYL (SUBLIMAZE) injection 50 mcg  50 mcg Intravenous Q2H PRN GMarikay Alar CNM   50 mcg at 09/03/17 1945  . lactated ringers infusion   Intravenous Continuous RDelsa Bern MD 100 mL/hr at 09/04/17 0939    . magnesium sulfate 40 grams in LR 500 mL  OB infusion  2 g/hr Intravenous Titrated GMarikay Alar CNM 25 mL/hr at 09/04/17 0828 2 g/hr at 09/04/17 0828  . prenatal multivitamin tablet 1 tablet  1 tablet Oral Q1200 GMarikay Alar CNM      . zolpidem (AMBIEN) tablet 5 mg  5 mg Oral QHS PRN GMarikay Alar CNM       Allergies  Allergen Reactions  . Cat Hair Extract Itching  . Percocet [Oxycodone-Acetaminophen] Nausea And Vomiting   Active Problems:   Preterm labor in third trimester   Preterm labor   Objective:  Vitals:   09/03/17 2338 09/04/17 0023 09/04/17 0442 09/04/17 0802  BP:  121/75 131/74 119/82  Pulse:  93 93 85  Resp:  20 20 18   Temp: 98.8 F (37.1 C)  98.7 F (37.1 C) 98.6 F (37 C)  TempSrc: Oral  Oral Oral  SpO2:    100%  Weight:      Height:        Results for orders placed or performed during the hospital encounter of 09/03/17 (from the past 48 hour(s))  Type and screen WObion    Status: None   Collection Time: 09/03/17  4:50 PM  Result Value Ref Range   ABO/RH(D) O POS  Antibody Screen NEG    Sample Expiration      09/06/2017 Performed at [facility], [address] [place], [place] [address]   CBC on admission     Status: Abnormal   Collection Time: 09/03/17  4:50 PM  Result Value Ref Range   WBC 9.4 4.0 - 10.5 K/uL   RBC 3.65 (L) 3.87 - 5.11 MIL/uL   Hemoglobin 7.7 (L) 12.0 - 15.0 g/dL    Comment: REPEATED TO VERIFY   HCT 24.8 (L) 36.0 - 46.0 %   MCV 67.9 (L) 78.0 - 100.0 fL   MCH 21.1 (L) 26.0 - 34.0 pg   MCHC 31.0 30.0 - 36.0 g/dL   RDW 16.7 (H) 11.5 - 15.5 %   Platelets 287 150 - 400 K/uL    Comment: Performed at [facility], [address] [place], [place] [address]  Comprehensive metabolic panel     Status: Abnormal   Collection Time: 09/03/17  4:54 PM  Result Value Ref Range   Sodium 136 135 - 145 mmol/L   Potassium 4.0 3.5 - 5.1 mmol/L   Chloride 104 98 - 111 mmol/L   CO2 22 22 - 32 mmol/L   Glucose, Bld 91 70 - 99 mg/dL   BUN <5 (L)  6 - 20 mg/dL    Comment: REPEATED TO VERIFY   Creatinine, Ser 0.48 0.44 - 1.00 mg/dL   Calcium 8.5 (L) 8.9 - 10.3 mg/dL   Total Protein 6.0 (L) 6.5 - 8.1 g/dL   Albumin 2.8 (L) 3.5 - 5.0 g/dL   AST 77 (H) 15 - 41 U/L   ALT 40 0 - 44 U/L   Alkaline Phosphatase 148 (H) 38 - 126 U/L   Total Bilirubin 1.0 0.3 - 1.2 mg/dL   GFR calc non Af Amer >60 >60 mL/min   GFR calc Af Amer >60 >60 mL/min    Comment: (NOTE) The eGFR has been calculated using the CKD EPI equation. This calculation has not been validated in all clinical situations. eGFR's persistently <60 mL/min signify possible Chronic Kidney Disease.    Anion gap 10 5 - 15    Comment: Performed at [facility], [address], [place], [place] [address]  Uric acid     Status: None   Collection Time: 09/03/17  4:54 PM  Result Value Ref Range   Uric Acid, Serum 3.5 2.5 - 7.1 mg/dL    Comment: Performed at [facility], [address] [place], [place] [address]  Amnisure rupture of membrane (rom)not at [facility]     Status: None   Collection Time: 09/03/17  7:15 PM  Result Value Ref Range   Amnisure ROM NEGATIVE     Comment: Performed at [facility], [address] [place], [place] [address]   Assessment/Plan:  32 y.o. G6P4 at [redacted]w[redacted]d Di/di twin boys, both transverse History of preterm birth and demise at 224weeks Cerclage in place Betamethasone x2 doses complete 08/26/17 Magnesium sulfate 4g bolus followed by 2g/hour drip for neuroprotection and tocolysis for 24 hours (until 1700 today) Asymptomatic anemia, will type and screen Elevated liver enzymes (AST 77) PCR not collected yesterday, reordered today Will continue to closely monitor   EMarikay Alar 09/04/17 10:52 AM

## 2017-09-05 ENCOUNTER — Inpatient Hospital Stay (HOSPITAL_COMMUNITY): Payer: Medicaid Other

## 2017-09-05 DIAGNOSIS — O99013 Anemia complicating pregnancy, third trimester: Secondary | ICD-10-CM | POA: Diagnosis present

## 2017-09-05 DIAGNOSIS — Z3A31 31 weeks gestation of pregnancy: Secondary | ICD-10-CM | POA: Diagnosis not present

## 2017-09-05 DIAGNOSIS — Z3A30 30 weeks gestation of pregnancy: Secondary | ICD-10-CM | POA: Diagnosis not present

## 2017-09-05 DIAGNOSIS — D649 Anemia, unspecified: Secondary | ICD-10-CM | POA: Diagnosis present

## 2017-09-05 DIAGNOSIS — O98313 Other infections with a predominantly sexual mode of transmission complicating pregnancy, third trimester: Secondary | ICD-10-CM | POA: Diagnosis present

## 2017-09-05 DIAGNOSIS — O99820 Streptococcus B carrier state complicating pregnancy: Secondary | ICD-10-CM | POA: Diagnosis present

## 2017-09-05 DIAGNOSIS — O30043 Twin pregnancy, dichorionic/diamniotic, third trimester: Secondary | ICD-10-CM | POA: Diagnosis present

## 2017-09-05 DIAGNOSIS — O322XX1 Maternal care for transverse and oblique lie, fetus 1: Secondary | ICD-10-CM | POA: Diagnosis present

## 2017-09-05 DIAGNOSIS — A6000 Herpesviral infection of urogenital system, unspecified: Secondary | ICD-10-CM | POA: Diagnosis present

## 2017-09-05 DIAGNOSIS — K8020 Calculus of gallbladder without cholecystitis without obstruction: Secondary | ICD-10-CM | POA: Diagnosis present

## 2017-09-05 DIAGNOSIS — O30049 Twin pregnancy, dichorionic/diamniotic, unspecified trimester: Secondary | ICD-10-CM | POA: Diagnosis not present

## 2017-09-05 DIAGNOSIS — O99613 Diseases of the digestive system complicating pregnancy, third trimester: Secondary | ICD-10-CM | POA: Diagnosis present

## 2017-09-05 LAB — PROTEIN / CREATININE RATIO, URINE
Creatinine, Urine: 71 mg/dL
PROTEIN CREATININE RATIO: 0.13 mg/mg{creat} (ref 0.00–0.15)
Total Protein, Urine: 9 mg/dL

## 2017-09-05 LAB — COMPREHENSIVE METABOLIC PANEL
ALT: 92 U/L — ABNORMAL HIGH (ref 0–44)
AST: 108 U/L — ABNORMAL HIGH (ref 15–41)
Albumin: 2.8 g/dL — ABNORMAL LOW (ref 3.5–5.0)
Alkaline Phosphatase: 216 U/L — ABNORMAL HIGH (ref 38–126)
Anion gap: 9 (ref 5–15)
BUN: <5 mg/dL — ABNORMAL LOW (ref 6–20)
CO2: 21 mmol/L — ABNORMAL LOW (ref 22–32)
Calcium: 8.2 mg/dL — ABNORMAL LOW (ref 8.9–10.3)
Chloride: 105 mmol/L (ref 98–111)
Creatinine, Ser: 0.59 mg/dL (ref 0.44–1.00)
GFR calc Af Amer: >60 mL/min (ref >60)
GFR calc non Af Amer: >60 mL/min (ref >60)
Glucose, Bld: 138 mg/dL — ABNORMAL HIGH (ref 70–99)
Potassium: 4.1 mmol/L (ref 3.5–5.1)
Sodium: 135 mmol/L (ref 135–145)
Total Bilirubin: 0.8 mg/dL (ref 0.3–1.2)
Total Protein: 6.4 g/dL — ABNORMAL LOW (ref 6.5–8.1)

## 2017-09-05 LAB — CBC WITH DIFFERENTIAL/PLATELET
Basophils Absolute: 0.1 10*3/uL (ref 0.0–0.1)
Basophils Relative: 1 %
EOS ABS: 0.8 10*3/uL — AB (ref 0.0–0.7)
EOS PCT: 13 %
HCT: 27.5 % — ABNORMAL LOW (ref 36.0–46.0)
HEMOGLOBIN: 8.4 g/dL — AB (ref 12.0–15.0)
LYMPHS ABS: 2.1 10*3/uL (ref 0.7–4.0)
LYMPHS PCT: 34 %
MCH: 20.8 pg — AB (ref 26.0–34.0)
MCHC: 30.5 g/dL (ref 30.0–36.0)
MCV: 68.2 fL — AB (ref 78.0–100.0)
MONOS PCT: 4 %
Monocytes Absolute: 0.2 10*3/uL (ref 0.1–1.0)
Neutro Abs: 3.1 10*3/uL (ref 1.7–7.7)
Neutrophils Relative %: 48 %
PLATELETS: 249 10*3/uL (ref 150–400)
RBC: 4.03 MIL/uL (ref 3.87–5.11)
RDW: 16.8 % — ABNORMAL HIGH (ref 11.5–15.5)
WBC: 6.3 10*3/uL (ref 4.0–10.5)

## 2017-09-05 LAB — LIPASE, BLOOD: Lipase: 23 U/L (ref 11–51)

## 2017-09-05 LAB — AMYLASE: Amylase: 75 U/L (ref 28–100)

## 2017-09-05 MED ORDER — PHENYLEPHRINE 40 MCG/ML (10ML) SYRINGE FOR IV PUSH (FOR BLOOD PRESSURE SUPPORT)
PREFILLED_SYRINGE | INTRAVENOUS | Status: AC
  Filled 2017-09-05: qty 10

## 2017-09-05 MED ORDER — DOCUSATE SODIUM 100 MG PO CAPS
100.0000 mg | ORAL_CAPSULE | Freq: Two times a day (BID) | ORAL | Status: DC
  Administered 2017-09-05 – 2017-09-07 (×3): 100 mg via ORAL
  Filled 2017-09-05 (×3): qty 1

## 2017-09-05 MED ORDER — FENTANYL CITRATE (PF) 100 MCG/2ML IJ SOLN
INTRAMUSCULAR | Status: AC
  Filled 2017-09-05: qty 2

## 2017-09-05 MED ORDER — FAMOTIDINE IN NACL 20-0.9 MG/50ML-% IV SOLN
20.0000 mg | Freq: Two times a day (BID) | INTRAVENOUS | Status: DC
  Administered 2017-09-05 – 2017-09-07 (×4): 20 mg via INTRAVENOUS
  Filled 2017-09-05 (×5): qty 50

## 2017-09-05 MED ORDER — OXYTOCIN 10 UNIT/ML IJ SOLN
INTRAMUSCULAR | Status: AC
Start: 2017-09-05 — End: ?
  Filled 2017-09-05: qty 4

## 2017-09-05 MED ORDER — MORPHINE SULFATE (PF) 0.5 MG/ML IJ SOLN
INTRAMUSCULAR | Status: AC
  Filled 2017-09-05: qty 10

## 2017-09-05 MED ORDER — BUPIVACAINE IN DEXTROSE 0.75-8.25 % IT SOLN
INTRATHECAL | Status: AC
  Filled 2017-09-05: qty 2

## 2017-09-05 MED ORDER — DIPHENHYDRAMINE HCL 50 MG/ML IJ SOLN
INTRAMUSCULAR | Status: AC
  Filled 2017-09-05: qty 1

## 2017-09-05 MED ORDER — ONDANSETRON HCL 4 MG/2ML IJ SOLN
INTRAMUSCULAR | Status: AC
  Filled 2017-09-05: qty 2

## 2017-09-05 MED ORDER — FERROUS SULFATE 325 (65 FE) MG PO TABS
325.0000 mg | ORAL_TABLET | Freq: Two times a day (BID) | ORAL | Status: DC
  Administered 2017-09-06 – 2017-09-07 (×3): 325 mg via ORAL
  Filled 2017-09-05 (×3): qty 1

## 2017-09-05 MED ORDER — DEXAMETHASONE SODIUM PHOSPHATE 4 MG/ML IJ SOLN
INTRAMUSCULAR | Status: AC
  Filled 2017-09-05: qty 1

## 2017-09-05 MED ORDER — PHENYLEPHRINE 8 MG IN D5W 100 ML (0.08MG/ML) PREMIX OPTIME
INJECTION | INTRAVENOUS | Status: AC
  Filled 2017-09-05: qty 100

## 2017-09-05 NOTE — Progress Notes (Signed)
CSW received consult for husband not being the father of the twins patient is carrying.  CSW spoke with bedside RN who also states concerns regarding unstable housing.   CSW met with patient and her husband, whom she gives permission to speak in front of.  Patient states she is here for preterm contractions, but that they have now stopped and that she is hopeful that she will be able to be discharged today as she has things she needs to get done.  Patient informed CSW that "I eat red dirt."  This seemed to come out of nowhere, however, RN had informed CSW prior to entering the room.  CSW spoke about the physical and mental components of Pica and assessed for other mental health concerns.  Patient states she has dealt with symptoms of depression throughout her life, although she states she does not feel she is experiencing symptoms currently.  CSW recommends outpatient counseling to address hx of depression and current Pica and patient states willingness.  She reports that she would like to stop eating dirt.   CSW asked about the relationship between patient and husband as well as the current living situation.  Husband answered, "she put me out, then she went to live with family and they put her out.  Now she is living in a shelter for woman and I live with my sister."  He reports that he is working on trying to find her and the children a place to live and plans to live there too even through they both agree that the marriage is over.  He states the twins are not his and patient reports that FOB is in [place] and not involved.  CSW inquired about how she is feeling about having twins and she replied, "I'm not giving them away like everyone wants me to."  She is planning to parent.  She reports that she will have been in the shelter in [facility] for 90 days as of August 9th and is unsure how long she is able to stay.  She does not appear concerned about her current housing situation.  She states they are on the [place] waiting list.  She reports that her 39 year old daughter is in the custody of her father in [place] and that her other three children, ages 94, 9 and 9, have been staying with MGF in [place] since June.  She states they will return "whenever I call him to bring them back."  She and her husband state that only the 57 and 37 year old are theirs together.   Patient states she receives Physicist, medical and [facility] and reports no further need for CSW intervention.  She plans to go to the walk in clinic at [facility] in [place] to initiate counseling and thanked CSW for the visit.  CSW will reassess and offer support when patient delivers.  No barriers to discharge today.

## 2017-09-05 NOTE — Progress Notes (Signed)
Vomited large amount of yellowish undigested food.  Patient claims feeling better after.  No vaginal discharge noted.  Declined antiemetic at this time.

## 2017-09-05 NOTE — Progress Notes (Addendum)
Patient ID: Tina MarisBrittany Rota, female   DOB: 08/16/1985, 32 y.o.   MRN: [id number] Tina Lambert is a 32 y.o. [id number] at 6959w5d admitted for preterm ctxs and mid-epigastric pain.   Subjective: Pt with vomiting last night after eating a cheeseburger.  She had 3 days of N/V prior to admission and reported ctxs upon admission but also a 10/10 midepigastric pain.  Objective: BP 131/71 (BP Location: Right Arm)   Pulse 100   Temp 98.7 F (37.1 C) (Oral)   Resp 18   Ht 5\' 2"  (1.575 m)   Wt 199 lb (90.3 kg)   LMP 02/02/2017   SpO2 100%   BMI 36.40 kg/m  I/O last 3 completed shifts: In: 4715.6 [P.O.:1768; I.V.:2947.6] Out: 2500 [Urine:2200; Emesis/NG output:300] Total I/O In: -  Out: 300 [Urine:300]  Physical Exam:  Gen: alert Chest/Lungs: cta bilaterally  Heart/Pulse: RRR  Abdomen: soft, gravid, nontender, NABS Uterine fundus: soft, nontender Skin & Color: warm and dry  Neurological: AO, DTRs 2+ EXT: no calf tenderness  FHT:  Cat 1 x 2 UC:   none SVE:   Dilation: 1.5(no tension on stitch) Effacement (%): 60 Exam by:: CNM  Labs: Lab Results  Component Value Date   WBC 6.3 09/05/2017   HGB 8.4 (L) 09/05/2017   HCT 27.5 (L) 09/05/2017   MCV 68.2 (L) 09/05/2017   PLT 249 09/05/2017    Assessment and Plan: has Variable fetal heart rate decelerations, antepartum; Preeclampsia; NSVD (normal spontaneous vaginal delivery); Preterm labor in third trimester; and Preterm labor on their problem list. N/V with elevated/worsening LFTs - order stat gallbladder u/s Repeat labs in the morning MFM consult Pt may need GI consult Iron BID with colace No s/sxs of PTL S/p Mg and BMZ BPs wnl Cont to observe NST q shift Start IV pepcid  Purcell Nailsngela Y Gaetana Kawahara 09/05/2017, 6:21 PM

## 2017-09-06 ENCOUNTER — Ambulatory Visit (HOSPITAL_COMMUNITY)
Admit: 2017-09-06 | Discharge: 2017-09-06 | Disposition: A | Discharge status: Home / Self Care | Payer: Medicaid Other | Attending: Family Medicine | Admitting: Family Medicine

## 2017-09-06 DIAGNOSIS — O30049 Twin pregnancy, dichorionic/diamniotic, unspecified trimester: Secondary | ICD-10-CM

## 2017-09-06 DIAGNOSIS — Z3A31 31 weeks gestation of pregnancy: Secondary | ICD-10-CM

## 2017-09-06 LAB — COMPREHENSIVE METABOLIC PANEL
ALBUMIN: 2.5 g/dL — AB (ref 3.5–5.0)
ALT: 67 U/L — ABNORMAL HIGH (ref 0–44)
ANION GAP: 9 (ref 5–15)
AST: 52 U/L — ABNORMAL HIGH (ref 15–41)
Alkaline Phosphatase: 191 U/L — ABNORMAL HIGH (ref 38–126)
CHLORIDE: 104 mmol/L (ref 98–111)
CO2: 21 mmol/L — ABNORMAL LOW (ref 22–32)
Calcium: 8.1 mg/dL — ABNORMAL LOW (ref 8.9–10.3)
Creatinine, Ser: 0.49 mg/dL (ref 0.44–1.00)
GFR calc Af Amer: >60 mL/min (ref >60)
GFR calc non Af Amer: >60 mL/min (ref >60)
GLUCOSE: 98 mg/dL (ref 70–99)
POTASSIUM: 4.2 mmol/L (ref 3.5–5.1)
SODIUM: 134 mmol/L — AB (ref 135–145)
TOTAL PROTEIN: 5.5 g/dL — AB (ref 6.5–8.1)
Total Bilirubin: 0.4 mg/dL (ref 0.3–1.2)

## 2017-09-06 LAB — CBC
HEMATOCRIT: 25 % — AB (ref 36.0–46.0)
HEMOGLOBIN: 7.7 g/dL — AB (ref 12.0–15.0)
MCH: 20.9 pg — ABNORMAL LOW (ref 26.0–34.0)
MCHC: 30.8 g/dL (ref 30.0–36.0)
MCV: 67.8 fL — AB (ref 78.0–100.0)
Platelets: 248 10*3/uL (ref 150–400)
RBC: 3.69 MIL/uL — ABNORMAL LOW (ref 3.87–5.11)
RDW: 16.7 % — AB (ref 11.5–15.5)
WBC: 7.3 10*3/uL (ref 4.0–10.5)

## 2017-09-06 MED ORDER — NIFEDIPINE 10 MG PO CAPS
20.0000 mg | ORAL_CAPSULE | ORAL | Status: DC | PRN
  Administered 2017-09-06: 20 mg via ORAL
  Filled 2017-09-06: qty 2

## 2017-09-06 MED ORDER — LACTATED RINGERS IV BOLUS
500.0000 mL | Freq: Once | INTRAVENOUS | Status: AC
  Administered 2017-09-06: 500 mL via INTRAVENOUS

## 2017-09-06 NOTE — Consult Note (Addendum)
CONSULT NOTE      MATERNAL FETAL MEDICINE CONSULT  Patient Name: Tina Lambert  Medical Record Number: Date of Birth: 10-06-1985  Requesting Physician Name: Dale Durham[place]ana Jade, FNP Date of Service:09/06/17  I had the pleasure of seeing Tina Lambert today at the high risk pregnancy unit where she was admitted with symptoms of preterm labor.  She received tocolysis and betamethasone.  On admission the cervix was 1.5 cm dilated and 60% effaced and the cerclage was in place.  Patient does not have uterine contractions now.  She has nausea and vomiting intermittently.  Abdominal ultrasound revealed gallstones.  Patient does not have fever or severe right upper quadrant pain.  Patient is a G6 P4-1-0-4 at 30 weeks and 6 days gestation with dichorionic-diamniotic twin pregnancy.  Patient had prophylactic cerclage placed at [redacted] weeks gestation because of a history of pregnancy loss at 21 weeks. On ultrasound performed on 08/28/2017 fetal growth were concordant.   OB History: 4 previous term vaginal deliveries followed by a 21-week pregnancy loss.  In that pregnancy she had PPROM and reports she also had infection which prompted delivery.  GYN history: No history of abnormal Pap smears or cervical surgeries.  Medical history: No history of diabetes or hypertension.  Surgical history: Cerclage, tonsillectomy.  Medications: Prenatal vitamins.  Allergies: Percocet (nausea and vomiting), cat hair extract (itching).  Social:-Tobacco drug or alcohol use.  Her partner is in good health and he is is not the father of her previous children.  Family: Mother died of liver cirrhosis complications.  No history of venous thromboembolism in the family.  Physical Examination: Patient is comfortably sitting up; not in distress Vitals stable Abdomen: Soft gravid uterus no tenderness Minimal tenderness in the right upper quadrant area.   Labs: AST 108 and ALT 92 (zero 7/31), AST 52 and ALT 67 (08/01),  hemoglobin 7.7, hematocrit 25 MCV 67.8, WBC 7.3, platelets 248, protein creatinine ratio 0.13.   I counseled the patient on the following: Twin pregnancy with cerclage: I counseled the patient and her twin pregnancy which is associated with increased incidence of preterm delivery.  Prophylactic cerclage does not  guarantee carrying pregnancy to term.  Patient has significant contractions and cervical dilation cerclage would be removed.  She does not have symptoms of preterm labor we recommend delivery at 38 weeks.  Cerclage may be removed at [redacted] weeks gestation or at the time of planned cesarean delivery if indicated.  Discussed the benefit of antenatal corticosteroids.  Elevated liver enzymes: I explained that gallstones may be the likely cause for increased liver enzymes.  In the absence of cholecystitis or severe symptoms expected management is likely to continue and no surgery is indicated.  If symptoms worsen I recommend surgical consultation.  Tylenol may also increase liver enzymes and is better avoided.    Recommendations: -Continue expectant management. -I expect the liver enzymes to decrease in the absence of cholecystitis. -Patient may be discharged if symptoms of nausea and vomiting improved.  Thank you for your consultation. If you have any questions or concerns please do not hesitate to contact me at the [facility] stop  Consultation including face-to-face counseling 40 minutes.

## 2017-09-06 NOTE — Progress Notes (Addendum)
Hospital day # 2 pregnancy at 5362w6d  S: well, reports good fetal activity, headache and nausea during the night; Headache still this am. Of Note: Mother died at age 752 from Cirrhosis of Liver; Liver problems started in pregnancy and developed Fatty Liver      Contractions:none, few      Vaginal bleeding:none        Vaginal discharge: no significant change  O: BP 119/85 (BP Location: Right Arm)   Pulse 66   Temp 98.4 F (36.9 C) (Oral)   Resp 18   Ht 5\' 2"  (1.575 m)   Wt 199 lb (90.3 kg)   LMP 02/02/2017   SpO2 99%   BMI 36.40 kg/m       Fetal tracings:reviewed and reassuring      Uterus non-tender      Extremities: extremities normal, atraumatic, no cyanosis or edema and no significant edema and no signs of DVT A: 3862w6d with Gallstones; Abdominal Pain     unchanged  P: continue current plan of care     MFM consult today Elmore GuiseLori A Clemmons CNM 09/06/2017 9:27 AM   Patient seen MFM consultation reviewed Feeling better with Protonix and Zofran: last vomiting episode early this am Discussed gallstones and necessity to respect a bland diet, need for surgery after delivery or before if evidence of cholecystitis PTL resolved and BMZ completed Anticipate d/c in am

## 2017-09-06 NOTE — Progress Notes (Signed)
S: Called by RN as patient having regular, mild contractions which are seen on tocometer. Patient cannot feel the contractions and did not know they were occurring.  O: Patient contracting every 2-3 minutes, painless A/P: Consulted Dr. Sallye OberKulwa, procardia ordered to relieve contractions.  LR bolus of 500ml ordered. Patient to remain on toco until contractions relieved. RN to call if patient's contractions do not stop or become painful. Will continue to monitor.  Tina Lambert 09/06/17 10:52 PM

## 2017-09-07 MED ORDER — FAMOTIDINE 20 MG PO TABS: 20.0000 mg | ORAL_TABLET | Freq: Two times a day (BID) | ORAL | 1 refills | Status: AC

## 2017-09-07 MED ORDER — NIFEDIPINE 20 MG PO CAPS: 20.0000 mg | ORAL_CAPSULE | ORAL | 0 refills | Status: DC | PRN

## 2017-09-07 MED ORDER — FERROUS SULFATE 325 (65 FE) MG PO TABS: 325.0000 mg | ORAL_TABLET | Freq: Two times a day (BID) | ORAL | 3 refills | Status: DC

## 2017-09-07 NOTE — Discharge Instructions (Signed)
Cholelithiasis Cholelithiasis is also called "gallstones." It is a kind of gallbladder disease. The gallbladder is an organ that stores a liquid (bile) that helps you digest fat. Gallstones may not cause symptoms (may be silent gallstones) until they cause a blockage, and then they can cause pain (gallbladder attack). Follow these instructions at home:  Take over-the-counter and prescription medicines only as told by your doctor.  Stay at a healthy weight.  Eat healthy foods. This includes: ? Eating fewer fatty foods, like fried foods. ? Eating fewer refined carbs (refined carbohydrates). Refined carbs are breads and grains that are highly processed, like white bread and white rice. Instead, choose whole grains like whole-wheat bread and brown rice. ? Eating more fiber. Almonds, fresh fruit, and beans are healthy sources of fiber.  Keep all follow-up visits as told by your doctor. This is important. Contact a doctor if:  You have sudden pain in the upper right side of your belly (abdomen). Pain might spread to your right shoulder or your chest. This may be a sign of a gallbladder attack.  You feel sick to your stomach (are nauseous).  You throw up (vomit).  You have been diagnosed with gallstones that have no symptoms and you get: ? Belly pain. ? Discomfort, burning, or fullness in the upper part of your belly (indigestion). Get help right away if:  You have sudden pain in the upper right side of your belly, and it lasts for more than 2 hours.  You have belly pain that lasts for more than 5 hours.  You have a fever or chills.  You keep feeling sick to your stomach or you keep throwing up.  Your skin or the whites of your eyes turn yellow (jaundice).  You have dark-colored pee (urine).  You have light-colored poop (stool). Summary  Cholelithiasis is also called "gallstones."  The gallbladder is an organ that stores a liquid (bile) that helps you digest fat.  Silent  gallstones are gallstones that do not cause symptoms.  A gallbladder attack may cause sudden pain in the upper right side of your belly. Pain might spread to your right shoulder or your chest. If this happens, contact your doctor.  If you have sudden pain in the upper right side of your belly that lasts for more than 2 hours, get help right away. This information is not intended to replace advice given to you by your health care provider. Make sure you discuss any questions you have with your health care provider. Document Released: 07/12/2007 Document Revised: 10/10/2015 Document Reviewed: 10/10/2015 Elsevier Interactive Patient Education  2017 Elsevier Inc.  

## 2017-09-07 NOTE — Discharge Summary (Signed)
ANTENATAL DISCHARGE SUMMARY  Patient ID: Peri MarisBrittany Jentsch MRN: [id number] DOB/AGE: [birth year] 32 y.o.  Admit date: 09/03/2017 Discharge date: 09/07/2017  Admission Diagnoses: 31WKS CTX, STOMACH TIGHTENING  Discharge Diagnoses: 31WKS CTX, STOMACH TIGHTENING       Cholelithiasis  Discharged Condition: stable    Consults: MFM  Treatments: IV hydration, antiemteics, Ultrasound  Disposition: home   Allergies as of 09/07/2017      Reactions   Cat Hair Extract Itching   Percocet [oxycodone-acetaminophen] Nausea And Vomiting      Medication List    TAKE these medications   famotidine 20 MG tablet Commonly known as:  PEPCID Take 1 tablet (20 mg total) by mouth 2 (two) times daily.   ferrous sulfate 325 (65 FE) MG tablet Take 1 tablet (325 mg total) by mouth 2 (two) times daily with a meal.   flintstones complete 60 MG chewable tablet Chew 2 tablets by mouth daily.   IRON PO Take 1 tablet by mouth daily.   MAKENA IM Inject 1 Dose into the muscle once a week. Monday or Tuesday at the doctors office   valACYclovir 1000 MG tablet Commonly known as:  VALTREX Take 500 mg by mouth 2 (two) times daily.      Follow-up Information    [facility]. Schedule an appointment as soon as possible for a visit in 1 week(s).   Specialty:  Obstetrics and Gynecology Why:  with Dr. Stark FallsPinn Contact information: [address] [place] [phone]          Signed: Rhea PinkLori A Kaidan Harpster, CNM  09/07/2017, 10:35 AM

## 2017-09-10 ENCOUNTER — Other Ambulatory Visit: Payer: Self-pay

## 2017-09-10 ENCOUNTER — Inpatient Hospital Stay (HOSPITAL_COMMUNITY)
Admission: AD | Admit: 2017-09-10 | Discharge: 2017-09-10 | Disposition: A | Discharge status: Home / Self Care | Payer: Medicaid Other | Source: Ambulatory Visit | Attending: Obstetrics & Gynecology | Admitting: Obstetrics & Gynecology

## 2017-09-10 ENCOUNTER — Encounter (HOSPITAL_COMMUNITY): Payer: Self-pay

## 2017-09-10 ENCOUNTER — Inpatient Hospital Stay (HOSPITAL_BASED_OUTPATIENT_CLINIC_OR_DEPARTMENT_OTHER): Payer: Medicaid Other

## 2017-09-10 DIAGNOSIS — O30043 Twin pregnancy, dichorionic/diamniotic, third trimester: Secondary | ICD-10-CM

## 2017-09-10 DIAGNOSIS — Z3A31 31 weeks gestation of pregnancy: Secondary | ICD-10-CM

## 2017-09-10 DIAGNOSIS — Z79899 Other long term (current) drug therapy: Secondary | ICD-10-CM | POA: Diagnosis not present

## 2017-09-10 DIAGNOSIS — O30003 Twin pregnancy, unspecified number of placenta and unspecified number of amniotic sacs, third trimester: Secondary | ICD-10-CM | POA: Diagnosis not present

## 2017-09-10 DIAGNOSIS — O9989 Other specified diseases and conditions complicating pregnancy, childbirth and the puerperium: Secondary | ICD-10-CM | POA: Diagnosis not present

## 2017-09-10 DIAGNOSIS — R0602 Shortness of breath: Secondary | ICD-10-CM | POA: Insufficient documentation

## 2017-09-10 DIAGNOSIS — O99213 Obesity complicating pregnancy, third trimester: Secondary | ICD-10-CM | POA: Diagnosis not present

## 2017-09-10 DIAGNOSIS — O26893 Other specified pregnancy related conditions, third trimester: Secondary | ICD-10-CM | POA: Diagnosis not present

## 2017-09-10 DIAGNOSIS — O99013 Anemia complicating pregnancy, third trimester: Secondary | ICD-10-CM | POA: Insufficient documentation

## 2017-09-10 DIAGNOSIS — Z8249 Family history of ischemic heart disease and other diseases of the circulatory system: Secondary | ICD-10-CM | POA: Diagnosis not present

## 2017-09-10 DIAGNOSIS — Z885 Allergy status to narcotic agent status: Secondary | ICD-10-CM | POA: Diagnosis not present

## 2017-09-10 DIAGNOSIS — O163 Unspecified maternal hypertension, third trimester: Secondary | ICD-10-CM | POA: Insufficient documentation

## 2017-09-10 DIAGNOSIS — O26613 Liver and biliary tract disorders in pregnancy, third trimester: Secondary | ICD-10-CM | POA: Diagnosis not present

## 2017-09-10 DIAGNOSIS — K8020 Calculus of gallbladder without cholecystitis without obstruction: Secondary | ICD-10-CM | POA: Insufficient documentation

## 2017-09-10 LAB — WET PREP, GENITAL
Sperm: NONE SEEN
TRICH WET PREP: NONE SEEN
Yeast Wet Prep HPF POC: NONE SEEN

## 2017-09-10 LAB — URINALYSIS, ROUTINE W REFLEX MICROSCOPIC
BACTERIA UA: NONE SEEN
Bilirubin Urine: NEGATIVE
GLUCOSE, UA: NEGATIVE mg/dL
Hgb urine dipstick: NEGATIVE
Ketones, ur: 20 mg/dL — AB
Nitrite: NEGATIVE
PROTEIN: NEGATIVE mg/dL
Specific Gravity, Urine: 1.013 (ref 1.005–1.030)
pH: 7 (ref 5.0–8.0)

## 2017-09-10 MED ORDER — NIFEDIPINE 10 MG PO CAPS
20.0000 mg | ORAL_CAPSULE | Freq: Once | ORAL | Status: AC
  Administered 2017-09-10: 20 mg via ORAL
  Filled 2017-09-10: qty 2

## 2017-09-10 MED ORDER — PROMETHAZINE HCL 25 MG/ML IJ SOLN: 12.5000 mg | Freq: Four times a day (QID) | INTRAMUSCULAR | Status: DC | PRN

## 2017-09-10 MED ORDER — METRONIDAZOLE 0.75 % VA GEL: 1.0000 | Freq: Two times a day (BID) | VAGINAL | 0 refills | Status: DC

## 2017-09-10 MED ORDER — NIFEDIPINE 10 MG PO CAPS: 10.0000 mg | ORAL_CAPSULE | Freq: Once | ORAL | Status: DC

## 2017-09-10 MED ORDER — PROMETHAZINE HCL 25 MG/ML IJ SOLN
25.0000 mg | Freq: Four times a day (QID) | INTRAMUSCULAR | Status: DC | PRN
  Administered 2017-09-10: 25 mg via INTRAMUSCULAR
  Filled 2017-09-10: qty 1

## 2017-09-10 MED ORDER — PROMETHAZINE HCL 12.5 MG PO TABS: 12.5000 mg | ORAL_TABLET | Freq: Four times a day (QID) | ORAL | 0 refills | Status: DC | PRN

## 2017-09-10 NOTE — MAU Note (Signed)
Pain 10/10 during ctx. Pain also in abd and back that is constant and also 10/10. Nauseated, vomiting, felt as she passed out. Bleeding only when wiping. Pain started after leaving hospital and is extremely severe tonight. States she has been taking procardia, last dose at 9:45pm. States she has difficulty breathing.

## 2017-09-10 NOTE — MAU Provider Note (Signed)
Chief Complaint:  Abdominal Pain and Shortness of Breath   None    HPI: Tina Lambert is a 32 y.o. [phone] at [redacted]w[redacted]d who presents to maternity admissions reporting contractions and abdominal pain.  Pt has di/di twin pregnancy with cerclage.  States unable to sleep and miserably.  Has taken Procardia at 2045 and Pepcid.  Has not tried anything for sleep.  Denies bleeding or leaking of fluid.  Pt was diagnosis with cholelithiasis at last admission which was a week ago.  Pt states following a bland diet.   Pt was noted to have anemia and taking iron.  Location: abdominal pain Quality: mild Severity: 10/10 in pain scale Duration: today Context: all day Timing: unsure Modifying factors: procardia    Leakage of fluid or vaginal bleeding. Good fetal movement.   Pregnancy Course:   Past Medical History:  Diagnosis Date  . Abnormal Pap smear   . Anemia   . Eczema   . Enlarged thyroid   . Genital herpes   . Gestational diabetes   . HSV-2 infection   . Hx gestational diabetes    with 2nd pregnancy  . Hypertension   . Infection    UTI  . Obesity   . Preeclampsia   . Preterm labor   . Prior pregnancy with fetal demise    21 weeks; 1st pregnancy  . SVD (spontaneous vaginal delivery) 2007   x 6 - 1 fetal demise at 21 weeks   OB History  Gravida Para Term Preterm AB Living  6 5 4 1  0 4  SAB TAB Ectopic Multiple Live Births  0       5    # Outcome Date GA Lbr Len/2nd Weight Sex Delivery Anes PTL Lv  6 Current           5 Term 10/03/13 [redacted]w[redacted]d  2.345 kg (5 lb 2.7 oz) M Vag-Spont EPI  LIV  4 Term 06/24/12 [redacted]w[redacted]d 05:38 / 00:14 2.3 kg (5 lb 1.1 oz) M Vag-Spont EPI  LIV     Birth Comments: None  3 Term 2011 [redacted]w[redacted]d 08:00 2.325 kg (5 lb 2 oz) F Vag-Spont None Y LIV  2 Term 2009 [redacted]w[redacted]d 16:00 3.118 kg (6 lb 14 oz) F Vag-Spont None N LIV  1 Preterm 2007 [redacted]w[redacted]d   F Vag-Spont None Y ND   Past Surgical History:  Procedure Laterality Date  . CERVICAL CERCLAGE N/A 05/11/2017   Procedure:  CERCLAGE CERVICAL;  Surgeon: Osborn Coho, MD;  Location: [facility];  Service: Gynecology;  Laterality: N/A;  . TONSILLECTOMY    . WISDOM TOOTH EXTRACTION     Family History  Problem Relation Age of Onset  . Cancer Paternal Uncle        1 prostate, 1 colon  . Hypertension Mother   . Hypertension Father   . Hypertension Maternal Grandmother   . Other Neg Hx    Social History   Tobacco Use  . Smoking status: Never Smoker  . Smokeless tobacco: Never Used  Substance Use Topics  . Alcohol use: No  . Drug use: No   Allergies  Allergen Reactions  . Cat Hair Extract Itching  . Percocet [Oxycodone-Acetaminophen] Nausea And Vomiting   Medications Prior to Admission  Medication Sig Dispense Refill Last Dose  . famotidine (PEPCID) 20 MG tablet Take 1 tablet (20 mg total) by mouth 2 (two) times daily. 60 tablet 1 09/09/2017 at Unknown time  . flintstones complete (FLINTSTONES) 60 MG chewable tablet Chew 2 tablets  by mouth daily.   09/09/2017 at Unknown time  . IRON PO Take 1 tablet by mouth daily.   09/09/2017 at Unknown time  . NIFEdipine (PROCARDIA) 20 MG capsule Take 1 capsule (20 mg total) by mouth every 4 (four) hours as needed (Contractions). 20 capsule 0 09/09/2017 at Unknown time  . valACYclovir (VALTREX) 1000 MG tablet Take 500 mg by mouth 2 (two) times daily.   09/09/2017 at Unknown time  . ferrous sulfate 325 (65 FE) MG tablet Take 1 tablet (325 mg total) by mouth 2 (two) times daily with a meal. 60 tablet 3   . HYDROXYprogesterone Caproate (MAKENA IM) Inject 1 Dose into the muscle once a week. Monday or Tuesday at the doctors office   Past Week at Unknown time    I have reviewed patient's Past Medical Hx, Surgical Hx, Family Hx, Social Hx, medications and allergies.   ROS:  Review of Systems  Constitutional: Negative.   HENT: Negative.   Eyes: Negative.   Respiratory: Negative.   Cardiovascular: Negative.   Gastrointestinal: Positive for abdominal pain, nausea and vomiting.   Endocrine: Negative.   Genitourinary: Negative.   Musculoskeletal: Negative.   Skin: Negative.   Allergic/Immunologic: Negative.   Neurological: Negative.   Hematological: Negative.     Physical Exam   Patient Vitals for the past 24 hrs:  BP Temp Temp src Pulse Resp SpO2 Height Weight  09/10/17 0221 123/81 - - - - - - -  09/10/17 0039 (!) 143/80 98.1 F (36.7 C) Oral (!) 102 18 - 5\' 3"  (1.6 m) 90.7 kg (200 lb)  09/10/17 0038 - - - - - 98 % - -   Results for orders placed or performed during the hospital encounter of 09/10/17 (from the past 24 hour(s))  Urinalysis, Routine w reflex microscopic     Status: Abnormal   Collection Time: 09/10/17 12:45 AM  Result Value Ref Range   Color, Urine YELLOW YELLOW   APPearance CLEAR CLEAR   Specific Gravity, Urine 1.013 1.005 - 1.030   pH 7.0 5.0 - 8.0   Glucose, UA NEGATIVE NEGATIVE mg/dL   Hgb urine dipstick NEGATIVE NEGATIVE   Bilirubin Urine NEGATIVE NEGATIVE   Ketones, ur 20 (A) NEGATIVE mg/dL   Protein, ur NEGATIVE NEGATIVE mg/dL   Nitrite NEGATIVE NEGATIVE   Leukocytes, UA MODERATE (A) NEGATIVE   RBC / HPF 0-5 0 - 5 RBC/hpf   WBC, UA 0-5 0 - 5 WBC/hpf   Bacteria, UA NONE SEEN NONE SEEN   Squamous Epithelial / LPF 0-5 0 - 5   Mucus PRESENT   Wet prep, genital     Status: Abnormal   Collection Time: 09/10/17  2:02 AM  Result Value Ref Range   Yeast Wet Prep HPF POC NONE SEEN NONE SEEN   Trich, Wet Prep NONE SEEN NONE SEEN   Clue Cells Wet Prep HPF POC PRESENT (A) NONE SEEN   WBC, Wet Prep HPF POC MANY (A) NONE SEEN   Sperm NONE SEEN    Constitutional: Well-developed, well-nourished female in no acute distress.  Cardiovascular: normal rate Respiratory: normal effort GI: Abd soft, non-tender, gravid appropriate for gestational age. Pos BS x 4 MS: Extremities nontender, no edema, normal ROM Neurologic: Alert and oriented x 4.  GU: Neg CVAT.  Pelvic: NEFG, thick green discharge, no blood, cervix clean. Cerclage intact SVE  1 cm/long     FHT:  Twin A baseline not tracing, bedside US showed active fetus, Twin B BL 145  moderate variability, accelerations present, no decelerations Contractions: q 3-5 mins mild to palpation   Labs: Results for orders placed or performed during the hospital encounter of 09/10/17 (from the past 24 hour(s))  Urinalysis, Routine w reflex microscopic     Status: Abnormal   Collection Time: 09/10/17 12:45 AM  Result Value Ref Range   Color, Urine YELLOW YELLOW   APPearance CLEAR CLEAR   Specific Gravity, Urine 1.013 1.005 - 1.030   pH 7.0 5.0 - 8.0   Glucose, UA NEGATIVE NEGATIVE mg/dL   Hgb urine dipstick NEGATIVE NEGATIVE   Bilirubin Urine NEGATIVE NEGATIVE   Ketones, ur 20 (A) NEGATIVE mg/dL   Protein, ur NEGATIVE NEGATIVE mg/dL   Nitrite NEGATIVE NEGATIVE   Leukocytes, UA MODERATE (A) NEGATIVE   RBC / HPF 0-5 0 - 5 RBC/hpf   WBC, UA 0-5 0 - 5 WBC/hpf   Bacteria, UA NONE SEEN NONE SEEN   Squamous Epithelial / LPF 0-5 0 - 5   Mucus PRESENT   Wet prep, genital     Status: Abnormal   Collection Time: 09/10/17  2:02 AM  Result Value Ref Range   Yeast Wet Prep HPF POC NONE SEEN NONE SEEN   Trich, Wet Prep NONE SEEN NONE SEEN   Clue Cells Wet Prep HPF POC PRESENT (A) NONE SEEN   WBC, Wet Prep HPF POC MANY (A) NONE SEEN   Sperm NONE SEEN     Imaging:  BPP for Twin A 8/8, Twin B 8/8  MAU Course: Orders Placed This Encounter  Procedures  . Wet prep, genital  . US MFM FETAL BPP WO NON STRESS  . US MFM FETAL BPP WO NST ADDL GESTATION  . Urinalysis, Routine w reflex microscopic  . Discharge patient Discharge disposition: 01-Home or Self Care; Discharge patient date: 09/10/2017   Meds ordered this encounter  Medications  . DISCONTD: promethazine (PHENERGAN) injection 12.5 mg  . DISCONTD: NIFEdipine (PROCARDIA) capsule 10 mg  . NIFEdipine (PROCARDIA) capsule 20 mg  . promethazine (PHENERGAN) injection 25 mg  . promethazine (PHENERGAN) 12.5 MG tablet    Sig: Take 1  tablet (12.5 mg total) by mouth every 6 (six) hours as needed for nausea or vomiting.    Dispense:  20 tablet    Refill:  0    MDM: PE and speculum done. Cerclage intact, SVE no change.  Unable to monitor twins very active.  Will do BPP, give phenergan and procardia for contractions.  Pt sleep all night.  BPP 6/6 for both twins.  D/C home Assessment: 1. Twin pregnancy in third trimester   2. Preterm contractions and abdominal pain 3. Cholelithiasis Severe anemia BV Plan: Discharge home in stable condition.  Labor precautions and fetal kick counts Phenergan for nausea and sleep, Metrogel for BV Follow-up Information    [facility] up in 1 week(s).   Specialty:  Obstetrics and Gynecology Contact information: [address] [place] [id number] [phone]            Kenney Housemanrothero, Keymari Sato Jean, Ina HomesCNM 09/10/2017 6:37 AM

## 2017-09-10 NOTE — MAU Note (Signed)
Pt here with c/o abdominal and back pain, SOB. Has gall stones and is having pain from that. Twins pregnancy. On Procardia, took last dose at 2100.

## 2017-09-15 ENCOUNTER — Inpatient Hospital Stay (HOSPITAL_COMMUNITY)
Admission: AD | Admit: 2017-09-15 | Discharge: 2017-09-18 | DRG: 831 | Disposition: A | Discharge status: Home / Self Care | Payer: Medicaid Other | Attending: Obstetrics & Gynecology | Admitting: Obstetrics & Gynecology

## 2017-09-15 ENCOUNTER — Encounter (HOSPITAL_COMMUNITY): Payer: Self-pay | Admitting: *Deleted

## 2017-09-15 DIAGNOSIS — K8020 Calculus of gallbladder without cholecystitis without obstruction: Secondary | ICD-10-CM | POA: Diagnosis present

## 2017-09-15 DIAGNOSIS — Z3A32 32 weeks gestation of pregnancy: Secondary | ICD-10-CM

## 2017-09-15 DIAGNOSIS — O98313 Other infections with a predominantly sexual mode of transmission complicating pregnancy, third trimester: Secondary | ICD-10-CM | POA: Diagnosis present

## 2017-09-15 DIAGNOSIS — O3430 Maternal care for cervical incompetence, unspecified trimester: Secondary | ICD-10-CM | POA: Diagnosis present

## 2017-09-15 DIAGNOSIS — O3433 Maternal care for cervical incompetence, third trimester: Secondary | ICD-10-CM | POA: Diagnosis present

## 2017-09-15 DIAGNOSIS — O4700 False labor before 37 completed weeks of gestation, unspecified trimester: Secondary | ICD-10-CM | POA: Diagnosis present

## 2017-09-15 DIAGNOSIS — A6000 Herpesviral infection of urogenital system, unspecified: Secondary | ICD-10-CM | POA: Diagnosis present

## 2017-09-15 DIAGNOSIS — O30003 Twin pregnancy, unspecified number of placenta and unspecified number of amniotic sacs, third trimester: Secondary | ICD-10-CM | POA: Diagnosis present

## 2017-09-15 DIAGNOSIS — O322XX1 Maternal care for transverse and oblique lie, fetus 1: Secondary | ICD-10-CM | POA: Diagnosis present

## 2017-09-15 DIAGNOSIS — O322XX2 Maternal care for transverse and oblique lie, fetus 2: Secondary | ICD-10-CM | POA: Diagnosis present

## 2017-09-15 DIAGNOSIS — O99820 Streptococcus B carrier state complicating pregnancy: Secondary | ICD-10-CM | POA: Diagnosis present

## 2017-09-15 DIAGNOSIS — O479 False labor, unspecified: Secondary | ICD-10-CM | POA: Diagnosis present

## 2017-09-15 DIAGNOSIS — O99613 Diseases of the digestive system complicating pregnancy, third trimester: Secondary | ICD-10-CM | POA: Diagnosis present

## 2017-09-15 DIAGNOSIS — D649 Anemia, unspecified: Secondary | ICD-10-CM | POA: Diagnosis present

## 2017-09-15 DIAGNOSIS — O30043 Twin pregnancy, dichorionic/diamniotic, third trimester: Secondary | ICD-10-CM | POA: Diagnosis present

## 2017-09-15 DIAGNOSIS — O99013 Anemia complicating pregnancy, third trimester: Secondary | ICD-10-CM | POA: Diagnosis present

## 2017-09-15 DIAGNOSIS — O26613 Liver and biliary tract disorders in pregnancy, third trimester: Secondary | ICD-10-CM

## 2017-09-15 MED ORDER — SODIUM CHLORIDE 0.9 % IV SOLN: 250.0000 mL | INTRAVENOUS | Status: DC | PRN

## 2017-09-15 MED ORDER — ZOLPIDEM TARTRATE 5 MG PO TABS
5.0000 mg | ORAL_TABLET | Freq: Every evening | ORAL | Status: DC | PRN
Start: 2017-09-15 — End: 2017-09-18

## 2017-09-15 MED ORDER — PRENATAL MULTIVITAMIN CH
1.0000 | ORAL_TABLET | Freq: Every day | ORAL | Status: DC
  Administered 2017-09-16 – 2017-09-18 (×2): 1 via ORAL
  Filled 2017-09-15 (×3): qty 1

## 2017-09-15 MED ORDER — DOCUSATE SODIUM 100 MG PO CAPS
100.0000 mg | ORAL_CAPSULE | Freq: Every day | ORAL | Status: DC
  Administered 2017-09-18: 100 mg via ORAL
  Filled 2017-09-15 (×3): qty 1

## 2017-09-15 MED ORDER — ACETAMINOPHEN 325 MG PO TABS: 650.0000 mg | ORAL_TABLET | ORAL | Status: DC | PRN

## 2017-09-15 MED ORDER — TERBUTALINE SULFATE 1 MG/ML IJ SOLN
0.2500 mg | INTRAMUSCULAR | Status: DC
  Filled 2017-09-15: qty 1

## 2017-09-15 MED ORDER — SODIUM CHLORIDE 0.9% FLUSH
3.0000 mL | Freq: Two times a day (BID) | INTRAVENOUS | Status: DC
  Administered 2017-09-16: 3 mL via INTRAVENOUS

## 2017-09-15 MED ORDER — CALCIUM CARBONATE ANTACID 500 MG PO CHEW: 2.0000 | CHEWABLE_TABLET | ORAL | Status: DC | PRN

## 2017-09-15 MED ORDER — SODIUM CHLORIDE 0.9% FLUSH: 3.0000 mL | INTRAVENOUS | Status: DC | PRN

## 2017-09-15 NOTE — H&P (Signed)
Tina Lambert is a 32 y.o. female presenting for preterm contractions with di/di twins and cerclage.  OB History    Gravida  6   Para  5   Term  4   Preterm  1   AB  0   Living  4     SAB  0   TAB      Ectopic      Multiple      Live Births  5          Past Medical History:  Diagnosis Date  . Abnormal Pap smear   . Anemia   . Eczema   . Enlarged thyroid   . Genital herpes   . Gestational diabetes   . HSV-2 infection   . Hx gestational diabetes    with 2nd pregnancy  . Hypertension   . Infection    UTI  . Obesity   . Preeclampsia   . Preterm labor   . Prior pregnancy with fetal demise    21 weeks; 1st pregnancy  . SVD (spontaneous vaginal delivery) 2007   x 6 - 1 fetal demise at 21 weeks   Past Surgical History:  Procedure Laterality Date  . CERVICAL CERCLAGE N/A 05/11/2017   Procedure: CERCLAGE CERVICAL;  Surgeon: Osborn Cohooberts, Angela, MD;  Location: [facility];  Service: Gynecology;  Laterality: N/A;  . TONSILLECTOMY    . WISDOM TOOTH EXTRACTION     Family History: family history includes Cancer in her paternal uncle; Hypertension in her father, maternal grandmother, and mother. Social History:  reports that she has never smoked. She has never used smokeless tobacco. She reports that she does not drink alcohol or use drugs.     Maternal Diabetes: No Genetic Screening: Normal Maternal Ultrasounds/Referrals: Normal Fetal Ultrasounds or other Referrals:  Referred to Materal Fetal Medicine  Maternal Substance Abuse:  No Significant Maternal Medications:  None Significant Maternal Lab Results:  Lab values include: Group B Strep positive  Other Comments:  Di/di twins with a history of preterm birth at 7221 weeks and fetal demise, cerclage removed yesterday by Dr. Mora ApplPinn  Review of Systems  Gastrointestinal: Positive for abdominal pain.  Genitourinary: Negative for dysuria, frequency and hematuria.  Patient denies vaginal pruritis, discharge, or bleeding.    Maternal Medical History:  Reason for admission: Contractions.   Contractions: Onset was 3-5 hours ago.   Frequency: regular.   Perceived severity is strong.    Fetal activity: Perceived fetal activity is normal.   Last perceived fetal movement was within the past hour.    Prenatal complications: Preterm labor.   No bleeding.   Prenatal Complications - Diabetes: none.    Dilation: 2.5 Effacement (%): 30 Exam by:: Tina Lambert, CNM  Vitals:   09/15/17 2240  BP: (!) 141/83  Pulse: 87  Resp: 17  Temp: 98.4 F (36.9 C)  SpO2: 100%   No results found for this or any previous visit (from the past 24 hour(s)).  MFM U/S 09/10/17: Impression  Patient was evaluated for preterm labor. Antenatal testing  was requested.  Dichorionic-diamniotic twin pregnancy.  Twin A: Lower fetus, transverse lie, posterior placenta.  Amniotic fluid is normal and good fetal activity is seen. BPP  8/8.  Twin B: Upper fetus, transverse lie, anterior placenta.  Amniotic fluid is normal and good fetal activity is seen. BPP  8/8.  U/S 08/28/17: Baby A is 3lbs2oz (53%ile) Baby B is 3lbs4oz (57%ile)  Maternal Exam:  Uterine Assessment: Contraction strength is moderate.  Contraction duration is 50 seconds. Contraction frequency is regular.   Abdomen: Patient reports no abdominal tenderness. Fundal height is Size > dates, twin pregnancy .    Introitus: Normal vulva. Normal vagina.  Cervix: Cervix evaluated by digital exam.     Physical Exam  Nursing note and vitals reviewed. Constitutional: She is oriented to person, place, and time. She appears well-developed and well-nourished.  HENT:  Head: Normocephalic.  Eyes: Pupils are equal, round, and reactive to light.  Cardiovascular: Normal rate, regular rhythm, normal heart sounds and intact distal pulses.  Respiratory: Effort normal and breath sounds normal.  GI: There is no tenderness.  Genitourinary: Vagina normal and uterus normal.   Musculoskeletal: Normal range of motion.  Neurological: She is alert and oriented to person, place, and time.  Skin: Skin is warm and dry.  Psychiatric: She has a normal mood and affect. Her behavior is normal. Judgment and thought content normal.     Fetal heart rate: Reactive NST x2, category 1 FHTs x2   Prenatal labs: ABO, Rh: --/--/O POS (07/29 1650) Antibody: NEG (07/29 1650) Rubella:  Immune RPR:   NR HBsAg:   NR HIV: Non Reactive (08/29 1331)  GBS:   Positive   Assessment/Plan: 32 y.o. [id number] at [redacted]w[redacted]d Di/di twin boys, both transverse History of preterm birth and demise at 63 weeks Cerclage removed Friday, 09/14/17 Betamethasone x2 doses complete 08/26/17 Preterm contractions Consulted Dr. Mora Appl Admit to observation [facility] labs for elevated blood pressures Terbutaline once for tocolysis If contractions cannot be stopped, will proceed with a primary cesarean section  Patient with history of asymptomatic anemia, will type and screen  Will continue to closely monitor   [name] 09/15/2017, 11:19 PM

## 2017-09-15 NOTE — MAU Note (Signed)
Pt states shes been having contractions for three weeks but they got worse around 2130.  Got cerclage removed Friday. Some spotting yesterday but not today, denies lof. Reports good fetal movement.

## 2017-09-16 ENCOUNTER — Encounter (HOSPITAL_COMMUNITY): Payer: Self-pay

## 2017-09-16 ENCOUNTER — Other Ambulatory Visit: Payer: Self-pay

## 2017-09-16 LAB — TYPE AND SCREEN
ABO/RH(D): O POS
ANTIBODY SCREEN: NEGATIVE

## 2017-09-16 LAB — URINALYSIS, ROUTINE W REFLEX MICROSCOPIC
GLUCOSE, UA: NEGATIVE mg/dL
Ketones, ur: 20 mg/dL — AB
Nitrite: NEGATIVE
PH: 5 (ref 5.0–8.0)
PROTEIN: 100 mg/dL — AB
Specific Gravity, Urine: 1.034 — ABNORMAL HIGH (ref 1.005–1.030)
WBC, UA: >50 WBC/hpf — ABNORMAL HIGH (ref 0–5)

## 2017-09-16 LAB — AMNISURE RUPTURE OF MEMBRANE (ROM) NOT AT ARMC: Amnisure ROM: NEGATIVE

## 2017-09-16 LAB — COMPREHENSIVE METABOLIC PANEL
ALBUMIN: 2.8 g/dL — AB (ref 3.5–5.0)
ALT: 61 U/L — ABNORMAL HIGH (ref 0–44)
AST: 53 U/L — AB (ref 15–41)
Alkaline Phosphatase: 181 U/L — ABNORMAL HIGH (ref 38–126)
Anion gap: 10 (ref 5–15)
BILIRUBIN TOTAL: 1.2 mg/dL (ref 0.3–1.2)
BUN: 6 mg/dL (ref 6–20)
CALCIUM: 8.6 mg/dL — AB (ref 8.9–10.3)
CO2: 19 mmol/L — ABNORMAL LOW (ref 22–32)
CREATININE: 0.62 mg/dL (ref 0.44–1.00)
Chloride: 106 mmol/L (ref 98–111)
GFR calc Af Amer: >60 mL/min (ref >60)
GLUCOSE: 104 mg/dL — AB (ref 70–99)
POTASSIUM: 3.7 mmol/L (ref 3.5–5.1)
Sodium: 135 mmol/L (ref 135–145)
TOTAL PROTEIN: 6.5 g/dL (ref 6.5–8.1)

## 2017-09-16 LAB — CBC WITH DIFFERENTIAL/PLATELET
BASOS ABS: 0 10*3/uL (ref 0.0–0.1)
BASOS PCT: 1 %
Eosinophils Absolute: 0.8 10*3/uL — ABNORMAL HIGH (ref 0.0–0.7)
Eosinophils Relative: 11 %
HCT: 25.5 % — ABNORMAL LOW (ref 36.0–46.0)
HEMOGLOBIN: 8 g/dL — AB (ref 12.0–15.0)
LYMPHS PCT: 32 %
Lymphs Abs: 2.1 10*3/uL (ref 0.7–4.0)
MCH: 20.7 pg — ABNORMAL LOW (ref 26.0–34.0)
MCHC: 31.4 g/dL (ref 30.0–36.0)
MCV: 66.1 fL — AB (ref 78.0–100.0)
MONO ABS: 0.2 10*3/uL (ref 0.1–1.0)
MONOS PCT: 3 %
NEUTROS PCT: 53 %
Neutro Abs: 3.5 10*3/uL (ref 1.7–7.7)
Platelets: 229 10*3/uL (ref 150–400)
RBC: 3.86 MIL/uL — ABNORMAL LOW (ref 3.87–5.11)
RDW: 16.7 % — AB (ref 11.5–15.5)
WBC: 6.7 10*3/uL (ref 4.0–10.5)

## 2017-09-16 LAB — LACTATE DEHYDROGENASE: LDH: 206 U/L — ABNORMAL HIGH (ref 98–192)

## 2017-09-16 LAB — WET PREP, GENITAL
CLUE CELLS WET PREP: NONE SEEN
SPERM: NONE SEEN
TRICH WET PREP: NONE SEEN
Yeast Wet Prep HPF POC: NONE SEEN

## 2017-09-16 LAB — URIC ACID: URIC ACID, SERUM: 4.9 mg/dL (ref 2.5–7.1)

## 2017-09-16 LAB — PROTEIN / CREATININE RATIO, URINE
Creatinine, Urine: 314 mg/dL
PROTEIN CREATININE RATIO: 0.15 mg/mg{creat} (ref 0.00–0.15)
Total Protein, Urine: 47 mg/dL

## 2017-09-16 MED ORDER — TERBUTALINE SULFATE 1 MG/ML IJ SOLN
INTRAMUSCULAR | Status: AC
  Filled 2017-09-16: qty 1

## 2017-09-16 MED ORDER — TERBUTALINE SULFATE 1 MG/ML IJ SOLN
0.2500 mg | Freq: Once | INTRAMUSCULAR | Status: AC | PRN
  Administered 2017-09-16: 0.25 mg via SUBCUTANEOUS
  Filled 2017-09-16: qty 1

## 2017-09-16 MED ORDER — CYCLOBENZAPRINE HCL 10 MG PO TABS
10.0000 mg | ORAL_TABLET | ORAL | Status: AC
  Administered 2017-09-16: 10 mg via ORAL
  Filled 2017-09-16: qty 1

## 2017-09-16 MED ORDER — TERBUTALINE SULFATE 1 MG/ML IJ SOLN: 0.2500 mg | Freq: Once | INTRAMUSCULAR | Status: DC

## 2017-09-16 MED ORDER — TERBUTALINE SULFATE 1 MG/ML IJ SOLN
0.2500 mg | Freq: Once | INTRAMUSCULAR | Status: AC
Start: 2017-09-16 — End: 2017-09-16
  Administered 2017-09-16: 0.25 mg via SUBCUTANEOUS

## 2017-09-16 MED ORDER — LACTATED RINGERS IV SOLN
INTRAVENOUS | Status: DC
  Administered 2017-09-16 – 2017-09-18 (×6): via INTRAVENOUS

## 2017-09-16 MED ORDER — LACTATED RINGERS IV BOLUS
500.0000 mL | Freq: Once | INTRAVENOUS | Status: AC
  Administered 2017-09-16: 500 mL via INTRAVENOUS

## 2017-09-16 NOTE — Progress Notes (Addendum)
Tina Lambert 32 y.o. [phone] @ [redacted]w[redacted]d with di/di twins  CHIEF COMPLAINT:   Chief Complaint  Patient presents with  . Contractions    Subjective  Pt is resting in bed with rare contractions.pt complains of vaginal pressure when she turns over in bed which are normal prenatal complaints of discomfort. She refused a dose of terbutaline earlier. IVF fkuid is running and she has received 500cc bolus. I spoke with Neonatolgy, Dr D. And updated the pts condition as stable but informed the pt plans as of now to be delivered by Dr. Mora Appl at [facility] and transfer twins to [place] if needed. I have ordered a neonatology consult which will happen later today.      Objective  Vitals:   09/16/17 0227 09/16/17 0750  BP: 112/81 130/89  Pulse: 96 (!) 107  Resp: 18 16  Temp: 98.3 F (36.8 C) 98.8 F (37.1 C)  SpO2: 99% 97%   Results for orders placed or performed during the hospital encounter of 09/15/17 (from the past 24 hour(s))  CBC with Differential/Platelet     Status: Abnormal   Collection Time: 09/15/17 11:20 PM  Result Value Ref Range   WBC 6.7 4.0 - 10.5 K/uL   RBC 3.86 (L) 3.87 - 5.11 MIL/uL   Hemoglobin 8.0 (L) 12.0 - 15.0 g/dL   HCT 45.4 (L) 09.8 - 11.9 %   MCV 66.1 (L) 78.0 - 100.0 fL   MCH 20.7 (L) 26.0 - 34.0 pg   MCHC 31.4 30.0 - 36.0 g/dL   RDW 14.7 (H) 82.9 - 56.2 %   Platelets 229 150 - 400 K/uL   Neutrophils Relative % 53 %   Neutro Abs 3.5 1.7 - 7.7 K/uL   Lymphocytes Relative 32 %   Lymphs Abs 2.1 0.7 - 4.0 K/uL   Monocytes Relative 3 %   Monocytes Absolute 0.2 0.1 - 1.0 K/uL   Eosinophils Relative 11 %   Eosinophils Absolute 0.8 (H) 0.0 - 0.7 K/uL   Basophils Relative 1 %   Basophils Absolute 0.0 0.0 - 0.1 K/uL  Comprehensive metabolic panel     Status: Abnormal   Collection Time: 09/15/17 11:20 PM  Result Value Ref Range   Sodium 135 135 - 145 mmol/L   Potassium 3.7 3.5 - 5.1 mmol/L   Chloride 106 98 - 111 mmol/L   CO2 19 (L) 22 - 32 mmol/L   Glucose, Bld  104 (H) 70 - 99 mg/dL   BUN 6 6 - 20 mg/dL   Creatinine, Ser 1.30 0.44 - 1.00 mg/dL   Calcium 8.6 (L) 8.9 - 10.3 mg/dL   Total Protein 6.5 6.5 - 8.1 g/dL   Albumin 2.8 (L) 3.5 - 5.0 g/dL   AST 53 (H) 15 - 41 U/L   ALT 61 (H) 0 - 44 U/L   Alkaline Phosphatase 181 (H) 38 - 126 U/L   Total Bilirubin 1.2 0.3 - 1.2 mg/dL   GFR calc non Af Amer >60 >60 mL/min   GFR calc Af Amer >60 >60 mL/min   Anion gap 10 5 - 15  Uric acid     Status: None   Collection Time: 09/15/17 11:20 PM  Result Value Ref Range   Uric Acid, Serum 4.9 2.5 - 7.1 mg/dL  Lactate dehydrogenase     Status: Abnormal   Collection Time: 09/15/17 11:20 PM  Result Value Ref Range   LDH 206 (H) 98 - 192 U/L  Wet prep, genital     Status: Abnormal  Collection Time: 09/16/17 12:52 AM  Result Value Ref Range   Yeast Wet Prep HPF POC NONE SEEN NONE SEEN   Trich, Wet Prep NONE SEEN NONE SEEN   Clue Cells Wet Prep HPF POC NONE SEEN NONE SEEN   WBC, Wet Prep HPF POC MANY (A) NONE SEEN   Sperm NONE SEEN   Am[facility]isure rupture of membrane (rom)[facility]     Status: None   Collection Time: 09/16/17  1:07 AM  Result Value Ref Range   Amnisure ROM NEGATIVE   Urinalysis, Routine w reflex microscopic     Status: Abnormal   Collection Time: 09/16/17  5:43 AM  Result Value Ref Range   Color, Urine AMBER (A) YELLOW   APPearance CLOUDY (A) CLEAR   Specific Gravity, Urine 1.034 (H) 1.005 - 1.030   pH 5.0 5.0 - 8.0   Glucose, UA NEGATIVE NEGATIVE mg/dL   Hgb urine dipstick SMALL (A) NEGATIVE   Bilirubin Urine SMALL (A) NEGATIVE   Ketones, ur 20 (A) NEGATIVE mg/dL   Protein, ur 045100 (A) NEGATIVE mg/dL   Nitrite NEGATIVE NEGATIVE   Leukocytes, UA MODERATE (A) NEGATIVE   RBC / HPF 21-50 0 - 5 RBC/hpf   WBC, UA >50 (H) 0 - 5 WBC/hpf   Bacteria, UA RARE (A) NONE SEEN   Squamous Epithelial / LPF 6-10 0 - 5   Mucus PRESENT   Protein / creatinine ratio, urine     Status: None   Collection Time: 09/16/17  5:43 AM  Result Value Ref Range    Creatinine, Urine 314.00 mg/dL   Total Protein, Urine 47 mg/dL   Protein Creatinine Ratio 0.15 0.00 - 0.15 mg/mg[Cre]   Examination:  General exam: Appears calm and comfortable  Respiratory system: Clear to auscultation. Respiratory effort normal. HEENT: [place]/AT, PERRLA, no thrush, no stridor. Cardiovascular system: S1 & S2 heard, RRR. No JVD, murmurs, rubs, gallops or clicks. No pedal edema. Gastrointestinal system: Abdomen is nondistended, soft and nontender. No organomegaly or masses felt. Normal bowel sounds heard. Central nervous system: Alert and oriented. No focal neurological deficits. Extremities: Symmetric 5 x 5 power. Skin: No rashes, lesions or ulcers Psychiatry: Judgement and insight appear normal. Mood & affect appropriate.   VITALS:  oral temperature is 98.8 F (37.1 C). Her blood pressure is 130/89 and her pulse is 107 (abnormal). Her respiration is 16 and oxygen saturation is 97%.   I personally reviewed Labs under Results section.  Radiology Reports No results found.     Assessment/Plan:  Preterm contractions Continue IVF Flexeril Regular Diet Neo Consult   Time spent: 20   LOS: 0 days   Lawson FiscalLori A Clemmons CNM  Pt stable will monitor  Plan MFM consult and US tomorrow Pt received BMZ on last visit to hospital

## 2017-09-16 NOTE — Progress Notes (Signed)
Offered pt Discharge tonight. Nurse states that [place] drain is a different brand and is making a noise that is not familiar. She has changed the batteries 4 times. The dressing appears intact with no drainage apparent. Pt is to let RN know if she wants to be discharged or stay until the AM to see Dr. Idamae SchullerVarnardo.

## 2017-09-17 ENCOUNTER — Observation Stay (HOSPITAL_BASED_OUTPATIENT_CLINIC_OR_DEPARTMENT_OTHER): Payer: Medicaid Other

## 2017-09-17 ENCOUNTER — Encounter (HOSPITAL_COMMUNITY): Payer: Medicaid Other

## 2017-09-17 DIAGNOSIS — O09213 Supervision of pregnancy with history of pre-term labor, third trimester: Secondary | ICD-10-CM

## 2017-09-17 DIAGNOSIS — O479 False labor, unspecified: Secondary | ICD-10-CM | POA: Diagnosis not present

## 2017-09-17 DIAGNOSIS — O99213 Obesity complicating pregnancy, third trimester: Secondary | ICD-10-CM

## 2017-09-17 DIAGNOSIS — O322XX2 Maternal care for transverse and oblique lie, fetus 2: Secondary | ICD-10-CM | POA: Diagnosis present

## 2017-09-17 DIAGNOSIS — O3433 Maternal care for cervical incompetence, third trimester: Secondary | ICD-10-CM

## 2017-09-17 DIAGNOSIS — O30043 Twin pregnancy, dichorionic/diamniotic, third trimester: Secondary | ICD-10-CM

## 2017-09-17 DIAGNOSIS — K8020 Calculus of gallbladder without cholecystitis without obstruction: Secondary | ICD-10-CM | POA: Diagnosis present

## 2017-09-17 DIAGNOSIS — A6000 Herpesviral infection of urogenital system, unspecified: Secondary | ICD-10-CM | POA: Diagnosis present

## 2017-09-17 DIAGNOSIS — O99013 Anemia complicating pregnancy, third trimester: Secondary | ICD-10-CM | POA: Diagnosis present

## 2017-09-17 DIAGNOSIS — O99820 Streptococcus B carrier state complicating pregnancy: Secondary | ICD-10-CM | POA: Diagnosis present

## 2017-09-17 DIAGNOSIS — Z3A32 32 weeks gestation of pregnancy: Secondary | ICD-10-CM

## 2017-09-17 DIAGNOSIS — D649 Anemia, unspecified: Secondary | ICD-10-CM | POA: Diagnosis present

## 2017-09-17 DIAGNOSIS — O99613 Diseases of the digestive system complicating pregnancy, third trimester: Secondary | ICD-10-CM | POA: Diagnosis present

## 2017-09-17 DIAGNOSIS — O98313 Other infections with a predominantly sexual mode of transmission complicating pregnancy, third trimester: Secondary | ICD-10-CM | POA: Diagnosis present

## 2017-09-17 DIAGNOSIS — O322XX1 Maternal care for transverse and oblique lie, fetus 1: Secondary | ICD-10-CM | POA: Diagnosis present

## 2017-09-17 MED ORDER — BETAMETHASONE SOD PHOS & ACET 6 (3-3) MG/ML IJ SUSP
12.0000 mg | INTRAMUSCULAR | Status: AC
Start: 2017-09-17 — End: 2017-09-18
  Administered 2017-09-17 – 2017-09-18 (×2): 12 mg via INTRAMUSCULAR
  Filled 2017-09-17 (×2): qty 2

## 2017-09-17 NOTE — Consult Note (Signed)
CONSULT NOTE      MATERNAL FETAL MEDICINE CONSULT  Patient Name: Tina Lambert  Medical Record Number: [id number] Date of Birth: 17-Oct-1985  Requesting Physician Name: Essie HartWalda Pinn, M.D. Date of Service: 09/17/17  [name] is admitted with preterm labor. She had a consultation with me on 09/06/17 when elevated liver enzymes were seen. She is currently admitted for uterine contractions. On examination (from discussion with Dr. Estanislado Pandyivard), the cervix was 2 cm/60% effaced. She reports she was taking Procardia (?10 mg) q4 to 6 hours prn. Other medications include Makena injections, iron, valacyclovir prophylaxis.   She is a G6 P4-1-0-4 at 32w 3d gestation with dichorionic-diamniotic twin pregnancy.  Patient had prophylactic cerclage placed at [redacted] weeks gestation because of a history of pregnancy loss at 21 weeks.  On 09/14/17, cerclage was removed at your office. She had no contractions then and the patient reports that the cerclage was "coming out." She reports mild uterine contractions now, but no leakage of amniotic fluid or vaginal bleeding.  Patient received a course of betamethasone 3 weeks ago.  Obstetric history is significant for 4 previous term vaginal deliveries followed by a 21-week pregnancy loss that followed PPROM.  She has anemia and takes iron supplements.  P/E: Patient is comfortable and not in distress. Vitals stable Abd: Soft gravid uterus.  A limited ultrasound study was performed. Dichorionic-diamniotic twin pregnancy.  Twin A: Lower fetus, transverse lie and head to maternal right, posterior placenta. Amniotic fluid is normal and good fetal activity is seen.   Twin B: Upper fetus, transverse lie and head to maternal left, anterior placenta. Amniotic fluid is normal and good fetal activity is seen.   I counseled the patient on the following: Preterm labor: I discussed tocolysis. Maintenance tocolysis has not shown to improve neonatal outcomes. Procardia may  provide some relief of uterine contractions, but is not necessary. If patient goes into active labor, delivery is the appropriate choice.  I discussed rescue course of betamethasone (previous dose was given more than 2 weeks ago). A second course may be given now (2 doses).  Patient is aware of NICU diversion and that if she delivers here, the babies are likely to be transferred to another hospital for care. I informed her that even though the newborns born at 32 weeks have good survival rates, in utero transfers (transfer of mother before delivery) have better outcomes.  Patient is aware that even if she is transferred now, she may not be delivered with no progress in labor.  A reasonable approach would be to keep her in the hospital till tomorrow and give her rescue course of steroids. If no cervical change is seen and the patient has stopped having contractions, she may be discharged. Transverse lie carries a small risk of cord prolapse. If the patient is willing, she can be transferred to another hospital.  Magnesium sulfate for fetal neuroprotection need not be given before delivery after 32 weeks' gestation unless the institution protocol differs.  Anemia:  Patient is aware of the possibility of blood transfusion following cesarean delivery. She is not symptomatic.  Thank you for your consult.  If you have any questions or concerns please do not hesitate to contact me at the [facility].  Consultation including face-to-face counseling 35 minutes.

## 2017-09-17 NOTE — Progress Notes (Addendum)
Tina Lambert 32 y.o. [phone] @ 6036w3d with di/di twins  CHIEF COMPLAINT:   Chief Complaint  Patient presents with  . Contractions    Subjective  Patient is asleep in bed. She is not complaining of contractions.     Objective  Vitals:   09/16/17 2002 09/16/17 2317  BP: 102/82 (!) 129/97  Pulse: 100 85  Resp: 16 18  Temp: 98.7 F (37.1 C) 98.4 F (36.9 C)  SpO2: 100% 100%   Results for orders placed or performed during the hospital encounter of 09/15/17 (from the past 24 hour(s))  Urinalysis, Routine w reflex microscopic     Status: Abnormal   Collection Time: 09/16/17  5:43 AM  Result Value Ref Range   Color, Urine AMBER (A) YELLOW   APPearance CLOUDY (A) CLEAR   Specific Gravity, Urine 1.034 (H) 1.005 - 1.030   pH 5.0 5.0 - 8.0   Glucose, UA NEGATIVE NEGATIVE mg/dL   Hgb urine dipstick SMALL (A) NEGATIVE   Bilirubin Urine SMALL (A) NEGATIVE   Ketones, ur 20 (A) NEGATIVE mg/dL   Protein, ur 454100 (A) NEGATIVE mg/dL   Nitrite NEGATIVE NEGATIVE   Leukocytes, UA MODERATE (A) NEGATIVE   RBC / HPF 21-50 0 - 5 RBC/hpf   WBC, UA >50 (H) 0 - 5 WBC/hpf   Bacteria, UA RARE (A) NONE SEEN   Squamous Epithelial / LPF 6-10 0 - 5   Mucus PRESENT   Protein / creatinine ratio, urine     Status: None   Collection Time: 09/16/17  5:43 AM  Result Value Ref Range   Creatinine, Urine 314.00 mg/dL   Total Protein, Urine 47 mg/dL   Protein Creatinine Ratio 0.15 0.00 - 0.15 mg/mg[Cre]  Type and screen [facility]     Status: None   Collection Time: 09/16/17  6:27 AM  Result Value Ref Range   ABO/RH(D) O POS    Antibody Screen NEG    Sample Expiration      09/19/2017 Performed at [facility], [address] [place], [place] [address]     Assessment/Plan:  32 y.o. 246P4 at 1936w3d with transverse di/di twins Reactive NST  BMZ complete on last admission, received magnesium sulfate as well that admission  Preterm contractions controlled with tocolytics  MFM  consult in am, Neonatology consult pending  Ultrasound in the morning  Patient is stable, will continue to monitor    Janeece Riggersllis K Greer 09/17/17 2:05 AM  Patient seen to review MFM recommendations discussed with Dr Judeth CornfieldShankar: 1. Give rescue BMZ today and tomorrow 2. Inpatient surveillance until tomorrow afternoon the D/C home if no change 3. Patient may opt for transfer to another facility due to closed NICU 4. Patient understands the unpredictability of onset of labor and inquires about which facility to go if labor before 36 weeks and NICU still closed. Patient will discuss further with Dr Mora ApplPinn

## 2017-09-17 NOTE — Progress Notes (Signed)
Patient called out and complained of feeling a couple of strong contractions so she was placed on the monitor to watch for uc's.

## 2017-09-18 ENCOUNTER — Encounter (HOSPITAL_COMMUNITY): Payer: Medicaid Other

## 2017-09-18 ENCOUNTER — Telehealth: Payer: Self-pay | Admitting: Hematology

## 2017-09-18 ENCOUNTER — Encounter: Payer: Self-pay | Admitting: Hematology

## 2017-09-18 DIAGNOSIS — D649 Anemia, unspecified: Secondary | ICD-10-CM | POA: Diagnosis present

## 2017-09-18 DIAGNOSIS — O30003 Twin pregnancy, unspecified number of placenta and unspecified number of amniotic sacs, third trimester: Secondary | ICD-10-CM | POA: Diagnosis present

## 2017-09-18 DIAGNOSIS — A6000 Herpesviral infection of urogenital system, unspecified: Secondary | ICD-10-CM | POA: Diagnosis present

## 2017-09-18 DIAGNOSIS — K8020 Calculus of gallbladder without cholecystitis without obstruction: Secondary | ICD-10-CM | POA: Diagnosis present

## 2017-09-18 DIAGNOSIS — O3430 Maternal care for cervical incompetence, unspecified trimester: Secondary | ICD-10-CM | POA: Diagnosis present

## 2017-09-18 DIAGNOSIS — O26613 Liver and biliary tract disorders in pregnancy, third trimester: Secondary | ICD-10-CM

## 2017-09-18 LAB — COMPREHENSIVE METABOLIC PANEL
ALBUMIN: 2.6 g/dL — AB (ref 3.5–5.0)
ALK PHOS: 167 U/L — AB (ref 38–126)
ALT: 66 U/L — AB (ref 0–44)
AST: 47 U/L — ABNORMAL HIGH (ref 15–41)
Anion gap: 10 (ref 5–15)
BUN: <5 mg/dL — ABNORMAL LOW (ref 6–20)
CO2: 21 mmol/L — AB (ref 22–32)
CREATININE: 0.52 mg/dL (ref 0.44–1.00)
Calcium: 8.5 mg/dL — ABNORMAL LOW (ref 8.9–10.3)
Chloride: 103 mmol/L (ref 98–111)
GFR calc Af Amer: >60 mL/min (ref >60)
GFR calc non Af Amer: >60 mL/min (ref >60)
GLUCOSE: 141 mg/dL — AB (ref 70–99)
Potassium: 3.4 mmol/L — ABNORMAL LOW (ref 3.5–5.1)
SODIUM: 134 mmol/L — AB (ref 135–145)
Total Bilirubin: 0.4 mg/dL (ref 0.3–1.2)
Total Protein: 6.2 g/dL — ABNORMAL LOW (ref 6.5–8.1)

## 2017-09-18 LAB — CBC
HCT: 25 % — ABNORMAL LOW (ref 36.0–46.0)
HEMOGLOBIN: 7.9 g/dL — AB (ref 12.0–15.0)
MCH: 21 pg — ABNORMAL LOW (ref 26.0–34.0)
MCHC: 31.6 g/dL (ref 30.0–36.0)
MCV: 66.3 fL — ABNORMAL LOW (ref 78.0–100.0)
Platelets: 234 10*3/uL (ref 150–400)
RBC: 3.77 MIL/uL — AB (ref 3.87–5.11)
RDW: 16.7 % — ABNORMAL HIGH (ref 11.5–15.5)
WBC: 6.6 10*3/uL (ref 4.0–10.5)

## 2017-09-18 NOTE — Telephone Encounter (Signed)
New hematology referral received from Dr. Jaymes GraffNaima Lambert at [facility] for anemia in pregnancy. An appointment has been scheduled for the pt to see Dr. Candise CheKale on 8/22 at 11am. Pt aware to arrive 30 minutes early. Letter mailed.

## 2017-09-18 NOTE — Discharge Summary (Addendum)
Physician Discharge Summary  Patient ID: Tina Lambert MRN: [id number] DOB/AGE: October 04, 1985 32 y.o.  Admit date: 09/15/2017 Discharge date: 09/18/2017  Admission Diagnoses:  Discharge Diagnoses:  Active Problems:   Preterm uterine contractions   Twin pregnancy in third trimester   Genital herpes simplex type 2   Anemia   Incompetent cervix in pregnancy   Cholelithiasis affecting pregnancy in third trimester, antepartum  Secondary problems:  History of preterm birth with fetal demise, history of preeclampsia, history of gestational diabetes  Discharged Condition: stable  Hospital Course: 32 y.o. G6P4 patient pregnant with female transverse di/di twins admitted for observation to rule out preterm labor. Patient received Terbutaline for tocolysis and had PIH labs done (which were negative for preeclampsia). Patient has continued to contract mildly and occasionally but is no longer having strong regular contractions like she was on admission. Labs were repeated prior to discharge and a urine sample was sent for culture, results pending. A limited ultrasound was done yesterday which was reassuring for fetal status. Patient is stable, her babies both have category 1 fetal heart tones and reactive NSTs, so patient will be discharged home but followed closely in the office. Patient to continue to take Procardia 20mg  PO PRN q4hrs contractions. She will be seen on 09/20/17 for a return OB visit as well as a BPP and growth ultrasound. Will facilitate referrals to GI MD regarding gallstones and Hematologist for possible iron transfusions for anemia. Patient discharged home with preterm labor precautions.   Consults: maternal fetal medicine  Significant Diagnostic Studies: Ultrasound: Impression  A limited ultrasound study was performed.  Dichorionic-diamniotic twin pregnancy.  Twin A: Lower fetus, transverse lie and head to maternal  right, posterior placenta. Amniotic fluid is normal and good  fetal activity is seen.  Twin B: Upper fetus, transverse lie and head to maternal left,  anterior placenta. Amniotic fluid is normal and good fetal  activity is seen.  Treatments: IV hydration, steroids: betamethasone x2 doses (3rd and 4th dose respectively this pregnancy) and Terbutaline for tocolysis   Vitals:   09/17/17 2327 09/18/17 0405 09/18/17 0802 09/18/17 1204  BP: 135/84 129/77 124/73 (!) 125/91  Pulse: 81 65 90 75  Resp: 18 16 17 18   Temp: 98.9 F (37.2 C) 98.2 F (36.8 C) 98.6 F (37 C) 98.7 F (37.1 C)  TempSrc: Oral Oral Oral Oral  SpO2: 95%  97% 100%  Weight:      Height:       Results for orders placed or performed during the hospital encounter of 09/15/17 (from the past 24 hour(s))  CBC     Status: Abnormal   Collection Time: 09/18/17  1:13 PM  Result Value Ref Range   WBC 6.6 4.0 - 10.5 K/uL   RBC 3.77 (L) 3.87 - 5.11 MIL/uL   Hemoglobin 7.9 (L) 12.0 - 15.0 g/dL   HCT 21.3 (L) 08.6 - 57.8 %   MCV 66.3 (L) 78.0 - 100.0 fL   MCH 21.0 (L) 26.0 - 34.0 pg   MCHC 31.6 30.0 - 36.0 g/dL   RDW 46.9 (H) 62.9 - 52.8 %   Platelets 234 150 - 400 K/uL  Comprehensive metabolic panel     Status: Abnormal   Collection Time: 09/18/17  1:13 PM  Result Value Ref Range   Sodium 134 (L) 135 - 145 mmol/L   Potassium 3.4 (L) 3.5 - 5.1 mmol/L   Chloride 103 98 - 111 mmol/L   CO2 21 (L) 22 - 32 mmol/L  Glucose, Bld 141 (H) 70 - 99 mg/dL   BUN <5 (L) 6 - 20 mg/dL   Creatinine, Ser 1.610.52 0.44 - 1.00 mg/dL   Calcium 8.5 (L) 8.9 - 10.3 mg/dL   Total Protein 6.2 (L) 6.5 - 8.1 g/dL   Albumin 2.6 (L) 3.5 - 5.0 g/dL   AST 47 (H) 15 - 41 U/L   ALT 66 (H) 0 - 44 U/L   Alkaline Phosphatase 167 (H) 38 - 126 U/L   Total Bilirubin 0.4 0.3 - 1.2 mg/dL   GFR calc non Af Amer >60 >60 mL/min   GFR calc Af Amer >60 >60 mL/min   Anion gap 10 5 - 15    Disposition: Discharge disposition: 01-Home or Self Care       Discharge Instructions    Discharge activity:  No Restrictions    Complete by:  As directed    Discharge diet:  No restrictions   Complete by:  As directed    Do not have sex or do anything that might make you have an orgasm   Complete by:  As directed    Fetal Kick Count:  Lie on our left side for one hour after a meal, and count the number of times your baby kicks.  If it is less than 5 times, get up, move around and drink some juice.  Repeat the test 30 minutes later.  If it is still less than 5 kicks in an hour, notify your doctor.   Complete by:  As directed    Notify physician for a general feeling that "something is not right"   Complete by:  As directed    Notify physician for increase or change in vaginal discharge   Complete by:  As directed    Notify physician for intestinal cramps, with or without diarrhea, sometimes described as "gas pain"   Complete by:  As directed    Notify physician for leaking of fluid   Complete by:  As directed    Notify physician for low, dull backache, unrelieved by heat or Tylenol   Complete by:  As directed    Notify physician for menstrual like cramps   Complete by:  As directed    Notify physician for pelvic pressure   Complete by:  As directed    Notify physician for uterine contractions.  These may be painless and feel like the uterus is tightening or the baby is  "balling up"   Complete by:  As directed    Notify physician for vaginal bleeding   Complete by:  As directed    PRETERM LABOR:  Includes any of the follwing symptoms that occur between 20 - [redacted] weeks gestation.  If these symptoms are not stopped, preterm labor can result in preterm delivery, placing your baby at risk   Complete by:  As directed      Allergies as of 09/18/2017      Reactions   Cat Hair Extract Itching   Percocet [oxycodone-acetaminophen] Nausea And Vomiting      Medication List    STOP taking these medications   metroNIDAZOLE 0.75 % vaginal gel Commonly known as:  METROGEL     TAKE these medications   famotidine 20 MG  tablet Commonly known as:  PEPCID Take 1 tablet (20 mg total) by mouth 2 (two) times daily.   ferrous sulfate 325 (65 FE) MG tablet Take 1 tablet (325 mg total) by mouth 2 (two) times daily with a meal.  flintstones complete 60 MG chewable tablet Chew 2 tablets by mouth daily.   MAKENA IM Inject 1 Dose into the muscle once a week. Monday or Tuesday at the doctors office   NIFEdipine 20 MG capsule Commonly known as:  PROCARDIA Take 1 capsule (20 mg total) by mouth every 4 (four) hours as needed (Contractions).   promethazine 12.5 MG tablet Commonly known as:  PHENERGAN Take 1 tablet (12.5 mg total) by mouth every 6 (six) hours as needed for nausea or vomiting.   valACYclovir 1000 MG tablet Commonly known as:  VALTREX Take 500 mg by mouth 2 (two) times daily.      Follow-up Information    [facility]. Go to.   Specialty:  Obstetrics and Gynecology Why:  Go to scheduled prenatal appointment 09/20/17.  Contact information: [address] [place] [phone]         Signed: Janeece Riggersllis K Nanetta Wiegman 09/18/2017, 2:59 PM

## 2017-09-18 NOTE — Progress Notes (Signed)
Hospital day # 1 pregnancy at 7165w4d--didi twin pregnancy.  S: Called by RN to evaluate patient.  Pt states contractions started at 3.  Denies bleeding or leaking of fluid.        Perception of contractions: none, regular, every 3 minutes      Vaginal bleeding: none now       Vaginal discharge:  no significant change  O: BP 129/77 (BP Location: Right Arm)   Pulse 65   Temp 98.2 F (36.8 C) (Oral)   Resp 16   Ht 5\' 3"  (1.6 m)   Wt 90.7 kg   LMP 02/02/2017   SpO2 95%   BMI 35.42 kg/m       Fetal tracings:Twin A FHT 124 accels no decels, Twin B 125 accels no decels      Contractions:   3 to 5 minutes palpated      Uterus gravid and non-tender      Extremities: extremities normal, atraumatic, no cyanosis or edema and no significant edema and no signs of DVT        SVE no change, no blood on glove A: 465w4d with didi twins preterm contractions Cat 1 strip     stable  P: Continue current plan of care      Upcoming tests/treatments:  Possible discharge in am.      MDs will follow  Kenney HousemanNancy Jean Prothero CNM, MSN 09/18/2017 4:44 AM

## 2017-09-18 NOTE — Discharge Instructions (Signed)
°Fetal Movement Counts °Patient Name: ________________________________________________ Patient Due Date: ____________________ °What is a fetal movement count? °A fetal movement count is the number of times that you feel your baby move during a certain amount of time. This may also be called a fetal kick count. A fetal movement count is recommended for every pregnant woman. You may be asked to start counting fetal movements as early as week 28 of your pregnancy. °Pay attention to when your baby is most active. You may notice your baby's sleep and wake cycles. You may also notice things that make your baby move more. You should do a fetal movement count: °· When your baby is normally most active. °· At the same time each day. ° °A good time to count movements is while you are resting, after having something to eat and drink. °How do I count fetal movements? °1. Find a quiet, comfortable area. Sit, or lie down on your side. °2. Write down the date, the start time and stop time, and the number of movements that you felt between those two times. Take this information with you to your health care visits. °3. For 2 hours, count kicks, flutters, swishes, rolls, and jabs. You should feel at least 10 movements during 2 hours. °4. You may stop counting after you have felt 10 movements. °5. If you do not feel 10 movements in 2 hours, have something to eat and drink. Then, keep resting and counting for 1 hour. If you feel at least 4 movements during that hour, you may stop counting. °Contact a health care provider if: °· You feel fewer than 4 movements in 2 hours. °· Your baby is not moving like he or she usually does. °Date: ____________ Start time: ____________ Stop time: ____________ Movements: ____________ °Date: ____________ Start time: ____________ Stop time: ____________ Movements: ____________ °Date: ____________ Start time: ____________ Stop time: ____________ Movements: ____________ °Date: ____________ Start time:  ____________ Stop time: ____________ Movements: ____________ °Date: ____________ Start time: ____________ Stop time: ____________ Movements: ____________ °Date: ____________ Start time: ____________ Stop time: ____________ Movements: ____________ °Date: ____________ Start time: ____________ Stop time: ____________ Movements: ____________ °Date: ____________ Start time: ____________ Stop time: ____________ Movements: ____________ °Date: ____________ Start time: ____________ Stop time: ____________ Movements: ____________ °This information is not intended to replace advice given to you by your health care provider. Make sure you discuss any questions you have with your health care provider. °Document Released: 02/22/2006 Document Revised: 09/22/2015 Document Reviewed: 03/04/2015 °Elsevier Interactive Patient Education © 2018 [employer] Inc[employer] ° ° °Preterm Labor and Birth Information °Pregnancy normally lasts 39-41 weeks. Preterm labor is when labor starts early. It starts before you have been pregnant for 37 whole weeks. °What are the risk factors for preterm labor? °Preterm labor is more likely to occur in women who: °· Have an infection while pregnant. °· Have a cervix that is short. °· Have gone into preterm labor before. °· Have had surgery on their cervix. °· Are younger than age 17. °· Are older than age 35. °· Are African American. °· Are pregnant with two or more babies. °· Take street drugs while pregnant. °· Smoke while pregnant. °· Do not gain enough weight while pregnant. °· Got pregnant right after another pregnancy. ° °What are the symptoms of preterm labor? °Symptoms of preterm labor include: °· Cramps. The cramps may feel like the cramps some women get during their period. The cramps may happen with watery poop (diarrhea). °· Pain in the belly (abdomen). °· Pain in   the lower back. °· Regular contractions or tightening. It may feel like your belly is getting tighter. °· Pressure in the lower belly that  seems to get stronger. °· More fluid (discharge) leaking from the vagina. The fluid may be watery or bloody. °· Water breaking. ° °Why is it important to notice signs of preterm labor? °Babies who are born early may not be fully developed. They have a higher chance for: °· Long-term heart problems. °· Long-term lung problems. °· Trouble controlling body systems, like breathing. °· Bleeding in the brain. °· A condition called cerebral palsy. °· Learning difficulties. °· Death. ° °These risks are highest for babies who are born before 34 weeks of pregnancy. °How is preterm labor treated? °Treatment depends on: °· How long you were pregnant. °· Your condition. °· The health of your baby. ° °Treatment may involve: °· Having a stitch (suture) placed in your cervix. When you give birth, your cervix opens so the baby can come out. The stitch keeps the cervix from opening too soon. °· Staying at the hospital. °· Taking or getting medicines, such as: °? Hormone medicines. °? Medicines to stop contractions. °? Medicines to help the baby’s lungs develop. °? Medicines to prevent your baby from having cerebral palsy. ° °What should I do if I am in preterm labor? °If you think you are going into labor too soon, call your doctor right away. °How can I prevent preterm labor? °· Do not use any tobacco products. °? Examples of these are cigarettes, chewing tobacco, and e-cigarettes. °? If you need help quitting, ask your doctor. °· Do not use street drugs. °· Do not use any medicines unless you ask your doctor if they are safe for you. °· Talk with your doctor before taking any herbal supplements. °· Make sure you gain enough weight. °· Watch for infection. If you think you might have an infection, get it checked right away. °· If you have gone into preterm labor before, tell your doctor. °This information is not intended to replace advice given to you by your health care provider. Make sure you discuss any questions you have with  your health care provider. °Document Released: 04/21/2008 Document Revised: 07/06/2015 Document Reviewed: 06/16/2015 °Elsevier Interactive Patient Education © 2018 Elsevier Inc. ° °

## 2017-09-19 LAB — CULTURE, OB URINE: Culture: <10000 — AB

## 2017-09-23 ENCOUNTER — Encounter (HOSPITAL_COMMUNITY): Payer: Self-pay | Admitting: Certified Registered Nurse Anesthetist

## 2017-09-23 ENCOUNTER — Inpatient Hospital Stay (HOSPITAL_COMMUNITY): Payer: Medicaid Other | Admitting: Anesthesiology

## 2017-09-23 ENCOUNTER — Encounter (HOSPITAL_COMMUNITY): Payer: Self-pay | Admitting: Anesthesiology

## 2017-09-23 ENCOUNTER — Encounter (HOSPITAL_COMMUNITY): Payer: Self-pay | Admitting: *Deleted

## 2017-09-23 ENCOUNTER — Encounter (HOSPITAL_COMMUNITY)
Admission: AD | Disposition: A | Discharge status: Home / Self Care | Payer: Self-pay | Source: Home / Self Care | Attending: Obstetrics & Gynecology

## 2017-09-23 ENCOUNTER — Inpatient Hospital Stay (HOSPITAL_COMMUNITY)
Admission: AD | Admit: 2017-09-23 | Discharge: 2017-09-26 | DRG: 783 | Disposition: A | Discharge status: Home / Self Care | Payer: Medicaid Other | Attending: Obstetrics & Gynecology | Admitting: Obstetrics & Gynecology

## 2017-09-23 DIAGNOSIS — O2662 Liver and biliary tract disorders in childbirth: Secondary | ICD-10-CM | POA: Diagnosis present

## 2017-09-23 DIAGNOSIS — O30043 Twin pregnancy, dichorionic/diamniotic, third trimester: Secondary | ICD-10-CM | POA: Diagnosis present

## 2017-09-23 DIAGNOSIS — Z3A33 33 weeks gestation of pregnancy: Secondary | ICD-10-CM | POA: Diagnosis not present

## 2017-09-23 DIAGNOSIS — O322XX1 Maternal care for transverse and oblique lie, fetus 1: Secondary | ICD-10-CM | POA: Diagnosis present

## 2017-09-23 DIAGNOSIS — O9902 Anemia complicating childbirth: Secondary | ICD-10-CM | POA: Diagnosis present

## 2017-09-23 DIAGNOSIS — O3433 Maternal care for cervical incompetence, third trimester: Secondary | ICD-10-CM | POA: Diagnosis present

## 2017-09-23 DIAGNOSIS — O321XX2 Maternal care for breech presentation, fetus 2: Secondary | ICD-10-CM | POA: Diagnosis present

## 2017-09-23 DIAGNOSIS — D649 Anemia, unspecified: Secondary | ICD-10-CM | POA: Diagnosis present

## 2017-09-23 DIAGNOSIS — K8020 Calculus of gallbladder without cholecystitis without obstruction: Secondary | ICD-10-CM

## 2017-09-23 DIAGNOSIS — O99214 Obesity complicating childbirth: Secondary | ICD-10-CM | POA: Diagnosis present

## 2017-09-23 DIAGNOSIS — O1414 Severe pre-eclampsia complicating childbirth: Principal | ICD-10-CM | POA: Diagnosis present

## 2017-09-23 DIAGNOSIS — O30003 Twin pregnancy, unspecified number of placenta and unspecified number of amniotic sacs, third trimester: Secondary | ICD-10-CM | POA: Diagnosis present

## 2017-09-23 DIAGNOSIS — O99824 Streptococcus B carrier state complicating childbirth: Secondary | ICD-10-CM | POA: Diagnosis present

## 2017-09-23 DIAGNOSIS — O1413 Severe pre-eclampsia, third trimester: Secondary | ICD-10-CM | POA: Diagnosis present

## 2017-09-23 DIAGNOSIS — O9832 Other infections with a predominantly sexual mode of transmission complicating childbirth: Secondary | ICD-10-CM | POA: Diagnosis present

## 2017-09-23 DIAGNOSIS — O1494 Unspecified pre-eclampsia, complicating childbirth: Secondary | ICD-10-CM | POA: Diagnosis present

## 2017-09-23 DIAGNOSIS — A6000 Herpesviral infection of urogenital system, unspecified: Secondary | ICD-10-CM | POA: Diagnosis present

## 2017-09-23 DIAGNOSIS — Z302 Encounter for sterilization: Secondary | ICD-10-CM

## 2017-09-23 DIAGNOSIS — O3430 Maternal care for cervical incompetence, unspecified trimester: Secondary | ICD-10-CM | POA: Diagnosis present

## 2017-09-23 DIAGNOSIS — O26613 Liver and biliary tract disorders in pregnancy, third trimester: Secondary | ICD-10-CM

## 2017-09-23 HISTORY — PX: CERVICAL CERCLAGE: SHX1329

## 2017-09-23 LAB — CBC WITH DIFFERENTIAL/PLATELET
BASOS PCT: 0 %
Basophils Absolute: 0 10*3/uL (ref 0.0–0.1)
EOS ABS: 0.8 10*3/uL — AB (ref 0.0–0.7)
EOS PCT: 12 %
HEMATOCRIT: 24.2 % — AB (ref 36.0–46.0)
Hemoglobin: 7.2 g/dL — ABNORMAL LOW (ref 12.0–15.0)
Lymphocytes Relative: 24 %
Lymphs Abs: 1.7 10*3/uL (ref 0.7–4.0)
MCH: 19.6 pg — ABNORMAL LOW (ref 26.0–34.0)
MCHC: 29.8 g/dL — AB (ref 30.0–36.0)
MCV: 65.9 fL — ABNORMAL LOW (ref 78.0–100.0)
MONO ABS: 0.6 10*3/uL (ref 0.1–1.0)
MONOS PCT: 9 %
NEUTROS ABS: 3.8 10*3/uL (ref 1.7–7.7)
Neutrophils Relative %: 55 %
PLATELETS: 295 10*3/uL (ref 150–400)
RBC: 3.67 MIL/uL — ABNORMAL LOW (ref 3.87–5.11)
RDW: 17.6 % — AB (ref 11.5–15.5)
WBC: 6.9 10*3/uL (ref 4.0–10.5)

## 2017-09-23 LAB — COMPREHENSIVE METABOLIC PANEL
ALBUMIN: 2.7 g/dL — AB (ref 3.5–5.0)
ALT: 96 U/L — ABNORMAL HIGH (ref 0–44)
ANION GAP: 11 (ref 5–15)
AST: 48 U/L — AB (ref 15–41)
Alkaline Phosphatase: 150 U/L — ABNORMAL HIGH (ref 38–126)
CHLORIDE: 103 mmol/L (ref 98–111)
CO2: 20 mmol/L — AB (ref 22–32)
Calcium: 8.2 mg/dL — ABNORMAL LOW (ref 8.9–10.3)
Creatinine, Ser: 0.43 mg/dL — ABNORMAL LOW (ref 0.44–1.00)
GFR calc Af Amer: >60 mL/min (ref >60)
GFR calc non Af Amer: >60 mL/min (ref >60)
GLUCOSE: 85 mg/dL (ref 70–99)
POTASSIUM: 3.5 mmol/L (ref 3.5–5.1)
SODIUM: 134 mmol/L — AB (ref 135–145)
TOTAL PROTEIN: 6 g/dL — AB (ref 6.5–8.1)
Total Bilirubin: 0.4 mg/dL (ref 0.3–1.2)

## 2017-09-23 LAB — URIC ACID: URIC ACID, SERUM: 4 mg/dL (ref 2.5–7.1)

## 2017-09-23 LAB — PROTEIN / CREATININE RATIO, URINE
Creatinine, Urine: 70 mg/dL
PROTEIN CREATININE RATIO: 0.17 mg/mg{creat} — AB (ref 0.00–0.15)
Total Protein, Urine: 12 mg/dL

## 2017-09-23 LAB — HEMOGLOBIN AND HEMATOCRIT, BLOOD
HEMATOCRIT: 25.8 % — AB (ref 36.0–46.0)
HEMOGLOBIN: 8.1 g/dL — AB (ref 12.0–15.0)

## 2017-09-23 LAB — PREPARE RBC (CROSSMATCH)

## 2017-09-23 LAB — LACTATE DEHYDROGENASE: LDH: 174 U/L (ref 98–192)

## 2017-09-23 SURGERY — Surgical Case
Anesthesia: Spinal | Site: Cervix | Wound class: Clean Contaminated

## 2017-09-23 MED ORDER — SODIUM CHLORIDE 0.9% FLUSH: 3.0000 mL | INTRAVENOUS | Status: DC | PRN

## 2017-09-23 MED ORDER — OXYTOCIN 10 UNIT/ML IJ SOLN
INTRAMUSCULAR | Status: AC
  Filled 2017-09-23: qty 4

## 2017-09-23 MED ORDER — NALOXONE HCL 4 MG/10ML IJ SOLN: 1.0000 ug/kg/h | INTRAVENOUS | Status: DC | PRN

## 2017-09-23 MED ORDER — LABETALOL HCL 5 MG/ML IV SOLN: 40.0000 mg | INTRAVENOUS | Status: DC | PRN

## 2017-09-23 MED ORDER — FERROUS SULFATE 325 (65 FE) MG PO TABS
325.0000 mg | ORAL_TABLET | Freq: Two times a day (BID) | ORAL | Status: DC
  Administered 2017-09-24: 325 mg via ORAL
  Filled 2017-09-23: qty 1

## 2017-09-23 MED ORDER — PROMETHAZINE HCL 25 MG/ML IJ SOLN
25.0000 mg | Freq: Four times a day (QID) | INTRAMUSCULAR | Status: DC | PRN
  Administered 2017-09-23: 25 mg via INTRAVENOUS
  Filled 2017-09-23: qty 1

## 2017-09-23 MED ORDER — ONDANSETRON HCL 4 MG/2ML IJ SOLN
INTRAMUSCULAR | Status: AC
  Filled 2017-09-23: qty 2

## 2017-09-23 MED ORDER — SCOPOLAMINE 1 MG/3DAYS TD PT72: 1.0000 | MEDICATED_PATCH | Freq: Once | TRANSDERMAL | Status: DC

## 2017-09-23 MED ORDER — PRENATAL MULTIVITAMIN CH
1.0000 | ORAL_TABLET | Freq: Every day | ORAL | Status: DC
Start: 2017-09-24 — End: 2017-09-26
  Administered 2017-09-24: 1 via ORAL
  Filled 2017-09-23: qty 1

## 2017-09-23 MED ORDER — NALOXONE HCL 0.4 MG/ML IJ SOLN
0.4000 mg | INTRAMUSCULAR | Status: DC | PRN
Start: 2017-09-23 — End: 2017-09-26

## 2017-09-23 MED ORDER — FENTANYL CITRATE (PF) 100 MCG/2ML IJ SOLN
25.0000 ug | INTRAMUSCULAR | Status: DC | PRN
  Administered 2017-09-23: 50 ug via INTRAVENOUS

## 2017-09-23 MED ORDER — FENTANYL CITRATE (PF) 100 MCG/2ML IJ SOLN: 25.0000 ug | INTRAMUSCULAR | Status: DC | PRN

## 2017-09-23 MED ORDER — SIMETHICONE 80 MG PO CHEW
80.0000 mg | CHEWABLE_TABLET | ORAL | Status: DC
  Administered 2017-09-24 – 2017-09-25 (×3): 80 mg via ORAL
  Filled 2017-09-23 (×3): qty 1

## 2017-09-23 MED ORDER — ACETAMINOPHEN 160 MG/5ML PO SOLN
325.0000 mg | ORAL | Status: DC | PRN
Start: 2017-09-23 — End: 2017-09-23

## 2017-09-23 MED ORDER — SOD CITRATE-CITRIC ACID 500-334 MG/5ML PO SOLN
30.0000 mL | ORAL | Status: DC
  Filled 2017-09-23: qty 15

## 2017-09-23 MED ORDER — MEPERIDINE HCL 25 MG/ML IJ SOLN: 6.2500 mg | INTRAMUSCULAR | Status: DC | PRN

## 2017-09-23 MED ORDER — KETOROLAC TROMETHAMINE 30 MG/ML IJ SOLN: 30.0000 mg | Freq: Four times a day (QID) | INTRAMUSCULAR | Status: AC | PRN

## 2017-09-23 MED ORDER — OXYCODONE HCL 5 MG PO TABS: 5.0000 mg | ORAL_TABLET | ORAL | Status: DC | PRN

## 2017-09-23 MED ORDER — LACTATED RINGERS IV SOLN
INTRAVENOUS | Status: DC | PRN
  Administered 2017-09-23: 13:00:00 via INTRAVENOUS

## 2017-09-23 MED ORDER — PHENYLEPHRINE 8 MG IN D5W 100 ML (0.08MG/ML) PREMIX OPTIME
INJECTION | INTRAVENOUS | Status: AC
  Filled 2017-09-23: qty 100

## 2017-09-23 MED ORDER — SODIUM CHLORIDE 0.9% IV SOLUTION: Freq: Once | INTRAVENOUS | Status: DC

## 2017-09-23 MED ORDER — SENNOSIDES-DOCUSATE SODIUM 8.6-50 MG PO TABS
2.0000 | ORAL_TABLET | ORAL | Status: DC
  Administered 2017-09-24 – 2017-09-25 (×3): 2 via ORAL
  Filled 2017-09-23 (×3): qty 2

## 2017-09-23 MED ORDER — KETOROLAC TROMETHAMINE 30 MG/ML IJ SOLN: 30.0000 mg | Freq: Four times a day (QID) | INTRAMUSCULAR | Status: DC | PRN

## 2017-09-23 MED ORDER — ONDANSETRON HCL 4 MG/2ML IJ SOLN: 4.0000 mg | Freq: Once | INTRAMUSCULAR | Status: DC | PRN

## 2017-09-23 MED ORDER — IBUPROFEN 600 MG PO TABS
600.0000 mg | ORAL_TABLET | Freq: Four times a day (QID) | ORAL | Status: DC
  Administered 2017-09-24 – 2017-09-26 (×10): 600 mg via ORAL
  Filled 2017-09-23 (×10): qty 1

## 2017-09-23 MED ORDER — NALBUPHINE HCL 10 MG/ML IJ SOLN: 5.0000 mg | Freq: Once | INTRAMUSCULAR | Status: DC | PRN

## 2017-09-23 MED ORDER — DIPHENHYDRAMINE HCL 50 MG/ML IJ SOLN: 12.5000 mg | INTRAMUSCULAR | Status: DC | PRN

## 2017-09-23 MED ORDER — LACTATED RINGERS IV SOLN
INTRAVENOUS | Status: DC | PRN
  Administered 2017-09-23: 40 [IU] via INTRAVENOUS

## 2017-09-23 MED ORDER — ACETAMINOPHEN 325 MG PO TABS: 650.0000 mg | ORAL_TABLET | ORAL | Status: DC | PRN

## 2017-09-23 MED ORDER — OXYCODONE HCL 5 MG PO TABS: 5.0000 mg | ORAL_TABLET | Freq: Once | ORAL | Status: DC | PRN

## 2017-09-23 MED ORDER — OXYCODONE HCL 5 MG PO TABS
10.0000 mg | ORAL_TABLET | ORAL | Status: DC | PRN
  Administered 2017-09-26: 10 mg via ORAL
  Filled 2017-09-23: qty 2

## 2017-09-23 MED ORDER — BUPIVACAINE HCL (PF) 0.5 % IJ SOLN
INTRAMUSCULAR | Status: DC | PRN
  Administered 2017-09-23: 20 mL

## 2017-09-23 MED ORDER — LACTATED RINGERS IV SOLN
INTRAVENOUS | Status: DC
  Administered 2017-09-23 – 2017-09-24 (×3): via INTRAVENOUS

## 2017-09-23 MED ORDER — OXYCODONE HCL 5 MG/5ML PO SOLN: 5.0000 mg | Freq: Once | ORAL | Status: DC | PRN

## 2017-09-23 MED ORDER — MAGNESIUM SULFATE 40 G IN LACTATED RINGERS - SIMPLE
2.0000 g/h | INTRAVENOUS | Status: AC
  Administered 2017-09-23 – 2017-09-24 (×3): 2 g/h via INTRAVENOUS
  Filled 2017-09-23: qty 500
  Filled 2017-09-23: qty 40

## 2017-09-23 MED ORDER — SIMETHICONE 80 MG PO CHEW
80.0000 mg | CHEWABLE_TABLET | Freq: Three times a day (TID) | ORAL | Status: DC
  Administered 2017-09-24 – 2017-09-26 (×7): 80 mg via ORAL
  Filled 2017-09-23 (×8): qty 1

## 2017-09-23 MED ORDER — KETOROLAC TROMETHAMINE 30 MG/ML IJ SOLN
30.0000 mg | Freq: Four times a day (QID) | INTRAMUSCULAR | Status: AC | PRN
  Administered 2017-09-23: 30 mg via INTRAVENOUS
  Filled 2017-09-23: qty 1

## 2017-09-23 MED ORDER — LABETALOL HCL 5 MG/ML IV SOLN: 80.0000 mg | INTRAVENOUS | Status: DC | PRN

## 2017-09-23 MED ORDER — DIBUCAINE 1 % RE OINT: 1.0000 "application " | TOPICAL_OINTMENT | RECTAL | Status: DC | PRN

## 2017-09-23 MED ORDER — PHENYLEPHRINE 8 MG IN D5W 100 ML (0.08MG/ML) PREMIX OPTIME
INJECTION | INTRAVENOUS | Status: DC | PRN
  Administered 2017-09-23: 60 ug/min via INTRAVENOUS

## 2017-09-23 MED ORDER — SCOPOLAMINE 1 MG/3DAYS TD PT72
1.0000 | MEDICATED_PATCH | Freq: Once | TRANSDERMAL | Status: DC
Start: 2017-09-23 — End: 2017-09-23

## 2017-09-23 MED ORDER — BUPIVACAINE HCL (PF) 0.5 % IJ SOLN
INTRAMUSCULAR | Status: AC
  Filled 2017-09-23: qty 30

## 2017-09-23 MED ORDER — ACETAMINOPHEN 325 MG PO TABS: 325.0000 mg | ORAL_TABLET | ORAL | Status: DC | PRN

## 2017-09-23 MED ORDER — SODIUM CHLORIDE 0.9% FLUSH
3.0000 mL | INTRAVENOUS | Status: DC | PRN
  Administered 2017-09-24: 3 mL via INTRAVENOUS
  Filled 2017-09-23: qty 3

## 2017-09-23 MED ORDER — BUPIVACAINE IN DEXTROSE 0.75-8.25 % IT SOLN
INTRATHECAL | Status: DC | PRN
  Administered 2017-09-23: 1.4 mL via INTRATHECAL

## 2017-09-23 MED ORDER — MORPHINE SULFATE (PF) 0.5 MG/ML IJ SOLN
INTRAMUSCULAR | Status: AC
  Filled 2017-09-23: qty 10

## 2017-09-23 MED ORDER — FENTANYL CITRATE (PF) 100 MCG/2ML IJ SOLN
INTRAMUSCULAR | Status: AC
  Filled 2017-09-23: qty 2

## 2017-09-23 MED ORDER — ONDANSETRON HCL 4 MG/2ML IJ SOLN: 4.0000 mg | Freq: Three times a day (TID) | INTRAMUSCULAR | Status: DC | PRN

## 2017-09-23 MED ORDER — ONDANSETRON HCL 4 MG/2ML IJ SOLN
4.0000 mg | Freq: Once | INTRAMUSCULAR | Status: AC | PRN
  Administered 2017-09-23: 4 mg via INTRAVENOUS

## 2017-09-23 MED ORDER — CEFAZOLIN SODIUM-DEXTROSE 2-4 GM/100ML-% IV SOLN
2.0000 g | INTRAVENOUS | Status: AC
  Administered 2017-09-23: 2 g via INTRAVENOUS
  Filled 2017-09-23: qty 100

## 2017-09-23 MED ORDER — FENTANYL CITRATE (PF) 100 MCG/2ML IJ SOLN
INTRAMUSCULAR | Status: DC | PRN
  Administered 2017-09-23: 15 ug via INTRATHECAL

## 2017-09-23 MED ORDER — ZOLPIDEM TARTRATE 5 MG PO TABS: 5.0000 mg | ORAL_TABLET | Freq: Every evening | ORAL | Status: DC | PRN

## 2017-09-23 MED ORDER — MENTHOL 3 MG MT LOZG: 1.0000 | LOZENGE | OROMUCOSAL | Status: DC | PRN

## 2017-09-23 MED ORDER — WITCH HAZEL-GLYCERIN EX PADS: 1.0000 "application " | MEDICATED_PAD | CUTANEOUS | Status: DC | PRN

## 2017-09-23 MED ORDER — ONDANSETRON HCL 4 MG/2ML IJ SOLN
INTRAMUSCULAR | Status: DC | PRN
  Administered 2017-09-23: 4 mg via INTRAVENOUS

## 2017-09-23 MED ORDER — NALBUPHINE HCL 10 MG/ML IJ SOLN: 5.0000 mg | INTRAMUSCULAR | Status: DC | PRN

## 2017-09-23 MED ORDER — DIPHENHYDRAMINE HCL 25 MG PO CAPS: 25.0000 mg | ORAL_CAPSULE | Freq: Four times a day (QID) | ORAL | Status: DC | PRN

## 2017-09-23 MED ORDER — SIMETHICONE 80 MG PO CHEW: 80.0000 mg | CHEWABLE_TABLET | ORAL | Status: DC | PRN

## 2017-09-23 MED ORDER — MAGNESIUM SULFATE 4 GM/100ML IV SOLN
4.0000 g | INTRAVENOUS | Status: DC
  Filled 2017-09-23: qty 100

## 2017-09-23 MED ORDER — NALOXONE HCL 0.4 MG/ML IJ SOLN: 0.4000 mg | INTRAMUSCULAR | Status: DC | PRN

## 2017-09-23 MED ORDER — LACTATED RINGERS IV SOLN
INTRAVENOUS | Status: DC
  Administered 2017-09-23: 11:00:00 via INTRAVENOUS

## 2017-09-23 MED ORDER — PHENYLEPHRINE 40 MCG/ML (10ML) SYRINGE FOR IV PUSH (FOR BLOOD PRESSURE SUPPORT)
PREFILLED_SYRINGE | INTRAVENOUS | Status: DC | PRN
  Administered 2017-09-23: 40 ug via INTRAVENOUS

## 2017-09-23 MED ORDER — PHENYLEPHRINE 40 MCG/ML (10ML) SYRINGE FOR IV PUSH (FOR BLOOD PRESSURE SUPPORT)
PREFILLED_SYRINGE | INTRAVENOUS | Status: AC
  Filled 2017-09-23: qty 10

## 2017-09-23 MED ORDER — DIPHENHYDRAMINE HCL 25 MG PO CAPS: 25.0000 mg | ORAL_CAPSULE | ORAL | Status: DC | PRN

## 2017-09-23 MED ORDER — COCONUT OIL OIL: 1.0000 "application " | TOPICAL_OIL | Status: DC | PRN

## 2017-09-23 MED ORDER — MAGNESIUM SULFATE 40 G IN LACTATED RINGERS - SIMPLE
2.0000 g/h | INTRAVENOUS | Status: DC
  Administered 2017-09-23: 2 g/h via INTRAVENOUS
  Filled 2017-09-23: qty 40

## 2017-09-23 MED ORDER — MAGNESIUM SULFATE BOLUS VIA INFUSION
4.0000 g | Freq: Once | INTRAVENOUS | Status: AC
  Administered 2017-09-23: 4 g via INTRAVENOUS
  Filled 2017-09-23: qty 500

## 2017-09-23 MED ORDER — SCOPOLAMINE 1 MG/3DAYS TD PT72
MEDICATED_PATCH | TRANSDERMAL | Status: AC
  Filled 2017-09-23: qty 1

## 2017-09-23 MED ORDER — TETANUS-DIPHTH-ACELL PERTUSSIS 5-2.5-18.5 LF-MCG/0.5 IM SUSP: 0.5000 mL | Freq: Once | INTRAMUSCULAR | Status: DC

## 2017-09-23 MED ORDER — LABETALOL HCL 5 MG/ML IV SOLN
20.0000 mg | INTRAVENOUS | Status: DC | PRN
Start: 2017-09-23 — End: 2017-09-26

## 2017-09-23 MED ORDER — DIPHENHYDRAMINE HCL 50 MG/ML IJ SOLN: 25.0000 mg | Freq: Once | INTRAMUSCULAR | Status: DC

## 2017-09-23 MED ORDER — MORPHINE SULFATE (PF) 0.5 MG/ML IJ SOLN
INTRAMUSCULAR | Status: DC | PRN
  Administered 2017-09-23: .15 mg via INTRATHECAL

## 2017-09-23 MED ORDER — OXYTOCIN 40 UNITS IN LACTATED RINGERS INFUSION - SIMPLE MED: 2.5000 [IU]/h | INTRAVENOUS | Status: AC

## 2017-09-23 MED ORDER — SOD CITRATE-CITRIC ACID 500-334 MG/5ML PO SOLN
30.0000 mL | Freq: Once | ORAL | Status: AC
  Administered 2017-09-23: 30 mL via ORAL

## 2017-09-23 MED ORDER — SCOPOLAMINE 1 MG/3DAYS TD PT72
MEDICATED_PATCH | TRANSDERMAL | Status: DC | PRN
  Administered 2017-09-23: 1 via TRANSDERMAL

## 2017-09-23 MED ORDER — ACETAMINOPHEN 160 MG/5ML PO SOLN: 325.0000 mg | ORAL | Status: DC | PRN

## 2017-09-23 MED ORDER — ACETAMINOPHEN 325 MG PO TABS
650.0000 mg | ORAL_TABLET | Freq: Once | ORAL | Status: AC
  Administered 2017-09-24: 650 mg via ORAL
  Filled 2017-09-23: qty 2

## 2017-09-23 MED ORDER — HYDRALAZINE HCL 20 MG/ML IJ SOLN: 10.0000 mg | INTRAMUSCULAR | Status: DC | PRN

## 2017-09-23 MED ORDER — KETOROLAC TROMETHAMINE 30 MG/ML IJ SOLN
30.0000 mg | Freq: Four times a day (QID) | INTRAMUSCULAR | Status: DC | PRN
Start: 2017-09-23 — End: 2017-09-23

## 2017-09-23 SURGICAL SUPPLY — 48 items
APL SKNCLS STERI-STRIP NONHPOA (GAUZE/BANDAGES/DRESSINGS) ×3
BENZOIN TINCTURE PRP APPL 2/3 (GAUZE/BANDAGES/DRESSINGS) ×5 IMPLANT
CHLORAPREP W/TINT 26ML (MISCELLANEOUS) ×5 IMPLANT
CLAMP CORD UMBIL (MISCELLANEOUS) IMPLANT
CLOSURE STERI STRIP 1/2 X4 (GAUZE/BANDAGES/DRESSINGS) ×3 IMPLANT
CLOTH BEACON ORANGE TIMEOUT ST (SAFETY) ×5 IMPLANT
DRAPE SHEET LG 3/4 BI-LAMINATE (DRAPES) ×6 IMPLANT
DRSG OPSITE POSTOP 4X10 (GAUZE/BANDAGES/DRESSINGS) ×5 IMPLANT
ELECT REM PT RETURN 9FT ADLT (ELECTROSURGICAL) ×5
ELECTRODE REM PT RTRN 9FT ADLT (ELECTROSURGICAL) ×3 IMPLANT
EXTRACTOR VACUUM BELL STYLE (SUCTIONS) IMPLANT
EXTRACTOR VACUUM KIWI (MISCELLANEOUS) IMPLANT
GLOVE BIO SURGEON STRL SZ 6.5 (GLOVE) ×6 IMPLANT
GLOVE BIO SURGEONS STRL SZ 6.5 (GLOVE) ×2
GLOVE BIOGEL PI IND STRL 7.0 (GLOVE) ×8 IMPLANT
GLOVE BIOGEL PI INDICATOR 7.0 (GLOVE) ×8
GOWN STRL REUS W/TWL LRG LVL3 (GOWN DISPOSABLE) ×18 IMPLANT
HOVERMATT SINGLE USE (MISCELLANEOUS) ×3 IMPLANT
KIT ABG SYR 3ML LUER SLIP (SYRINGE) ×3 IMPLANT
LIGASURE IMPACT 36 18CM CVD LR (INSTRUMENTS) ×3 IMPLANT
NDL HYPO 25X5/8 SAFETYGLIDE (NEEDLE) IMPLANT
NEEDLE HYPO 22GX1.5 SAFETY (NEEDLE) ×6 IMPLANT
NEEDLE HYPO 25X5/8 SAFETYGLIDE (NEEDLE) IMPLANT
NS IRRIG 1000ML POUR BTL (IV SOLUTION) ×5 IMPLANT
PACK C SECTION WH (CUSTOM PROCEDURE TRAY) ×5 IMPLANT
PAD ABD 7.5X8 STRL (GAUZE/BANDAGES/DRESSINGS) ×3 IMPLANT
PAD OB MATERNITY 4.3X12.25 (PERSONAL CARE ITEMS) ×5 IMPLANT
PENCIL SMOKE EVAC W/HOLSTER (ELECTROSURGICAL) ×5 IMPLANT
RETRACTOR TRAXI PANNICULUS (MISCELLANEOUS) ×1 IMPLANT
RTRCTR C-SECT PINK 25CM LRG (MISCELLANEOUS) ×5 IMPLANT
SPONGE GAUZE 4X4 12PLY STER LF (GAUZE/BANDAGES/DRESSINGS) ×6 IMPLANT
SPONGE LAP 18X18 X RAY DECT (DISPOSABLE) ×3 IMPLANT
STRIP CLOSURE SKIN 1/2X4 (GAUZE/BANDAGES/DRESSINGS) ×4 IMPLANT
SUT CHROMIC 2 0 CT 1 (SUTURE) ×10 IMPLANT
SUT MNCRL 0 VIOLET CTX 36 (SUTURE) ×6 IMPLANT
SUT MONOCRYL 0 CTX 36 (SUTURE) ×4
SUT PDS AB 0 CTX 36 PDP370T (SUTURE) IMPLANT
SUT PLAIN 2 0 (SUTURE) ×10
SUT PLAIN ABS 2-0 CT1 27XMFL (SUTURE) ×2 IMPLANT
SUT VIC AB 0 CTX 36 (SUTURE) ×10
SUT VIC AB 0 CTX36XBRD ANBCTRL (SUTURE) ×6 IMPLANT
SUT VIC AB 2-0 CT1 (SUTURE) ×3 IMPLANT
SUT VIC AB 4-0 KS 27 (SUTURE) ×5 IMPLANT
SYR CONTROL 10ML LL (SYRINGE) IMPLANT
TOWEL OR 17X24 6PK STRL BLUE (TOWEL DISPOSABLE) ×5 IMPLANT
TRAXI PANNICULUS RETRACTOR (MISCELLANEOUS) ×2
TRAY FOLEY W/BAG SLVR 14FR LF (SET/KITS/TRAYS/PACK) ×5 IMPLANT
WATER STERILE IRR 1000ML POUR (IV SOLUTION) ×3 IMPLANT

## 2017-09-23 NOTE — Transfer of Care (Signed)
Immediate Anesthesia Transfer of Care Note  Patient: Tina Lambert  Procedure(s) Performed: CESAREAN SECTION MULTI-GESTATIONAL WITH TUBAL (N/A Abdomen) CERCLAGE CERVICAL REMOVAL (N/A Cervix)  Patient Location: PACU  Anesthesia Type:Spinal  Level of Consciousness: awake, alert  and oriented  Airway & Oxygen Therapy: Patient Spontanous Breathing  Post-op Assessment: Report given to RN and Post -op Vital signs reviewed and stable  Post vital signs: Reviewed and stable  Last Vitals:  Vitals Value Taken Time  BP    Temp    Pulse    Resp 19 09/23/2017  2:05 PM  SpO2    Vitals shown include unvalidated device data.  Last Pain: There were no vitals filed for this visit.       Complications: No apparent anesthesia complications

## 2017-09-23 NOTE — H&P (Signed)
Tina MarisBrittany Crego is a 32 y.o. female presenting for preeclampsia with severe features. She is 6629w2d pregnant with di/di twins, both female and both transverse lie.  OB History    Gravida  6   Para  5   Term  4   Preterm  1   AB  0   Living  4     SAB  0   TAB      Ectopic      Multiple      Live Births  5          Past Medical History:  Diagnosis Date  . Abnormal Pap smear   . Anemia   . Eczema   . Enlarged thyroid   . Genital herpes   . HSV-2 infection   . Hx gestational diabetes    with 2nd pregnancy  . Hypertension   . Infection    UTI  . Obesity   . Preeclampsia   . Preterm labor   . Prior pregnancy with fetal demise    21 weeks; 1st pregnancy  . SVD (spontaneous vaginal delivery) 2007   x 6 - 1 fetal demise at 21 weeks   Past Surgical History:  Procedure Laterality Date  . CERVICAL CERCLAGE N/A 05/11/2017   Procedure: CERCLAGE CERVICAL;  Surgeon: Osborn Cohooberts, Angela, MD;  Location: [facility];  Service: Gynecology;  Laterality: N/A;  . TONSILLECTOMY    . WISDOM TOOTH EXTRACTION     Family History: family history includes Cancer in her paternal uncle; Hypertension in her father, maternal grandmother, and mother. Social History:  reports that she has never smoked. She has never used smokeless tobacco. She reports that she does not drink alcohol or use drugs.     Maternal Diabetes: No Genetic Screening: Normal Maternal Ultrasounds/Referrals: Normal Fetal Ultrasounds or other Referrals:  Referred to Materal Fetal Medicine  Maternal Substance Abuse:  No Significant Maternal Medications:  None Significant Maternal Lab Results:  Lab values include: Group B Strep positive Other Comments:  None  Review of Systems  Eyes: Positive for blurred vision.  Cardiovascular: Positive for leg swelling.  Gastrointestinal: Positive for abdominal pain.  Neurological: Positive for headaches.  All other systems reviewed and are negative.  Maternal Medical History:   Fetal activity: Perceived fetal activity is normal.   Last perceived fetal movement was within the past hour.    Prenatal complications: Pre-eclampsia.   Prenatal Complications - Diabetes: none.       Vitals:   09/23/17 1005  BP: (!) 142/89  Pulse: 76    Exam Physical Exam  Nursing note and vitals reviewed. Constitutional: She is oriented to person, place, and time. She appears well-developed and well-nourished.  HENT:  Head: Normocephalic.  Eyes: Pupils are equal, round, and reactive to light.  Cardiovascular: Normal rate, regular rhythm and normal heart sounds.  Respiratory: Effort normal and breath sounds normal.  Musculoskeletal: Normal range of motion.  Neurological: She is alert and oriented to person, place, and time.  Skin: Skin is warm and dry.  Psychiatric: She has a normal mood and affect. Her behavior is normal. Judgment and thought content normal.    Prenatal labs: ABO, Rh: --/--/O POS (08/11 [id number]) Antibody: NEG (08/11 0627) Rubella:  Immune RPR:   NR HBsAg:   NR HIV: Non Reactive (08/29 1331)  GBS:   Positive   Assessment/Plan: 32 y.o. [id number] at 5768w1d Di/di twin boys, both transverse Preeclampsia with severe features diagnosed at [facility], patient reporting severe symptoms  Betamethasone x2 doses complete 08/26/17 PIH labs repeated and results pending Magensium Sulfate 4g bolus preop Patient [facility] for cesarean section by Dr. Loma SenderPinn  Symphony Demuro Fidela SalisburyK Delcia Spitzley 09/23/2017, 10:26 AM

## 2017-09-23 NOTE — Anesthesia Preprocedure Evaluation (Signed)
Anesthesia Evaluation  Patient identified by MRN, date of birth, ID band Patient awake    Reviewed: Allergy & Precautions, H&P , Patient's Chart, lab work & pertinent test results  Airway Mallampati: I  TM Distance: >3 FB Neck ROM: full    Dental no notable dental hx. (+) Teeth Intact   Pulmonary neg pulmonary ROS,    Pulmonary exam normal breath sounds clear to auscultation       Cardiovascular hypertension, Normal cardiovascular exam     Neuro/Psych negative neurological ROS  negative psych ROS   GI/Hepatic negative GI ROS, Neg liver ROS,   Endo/Other  negative endocrine ROS  Renal/GU negative Renal ROS  negative genitourinary   Musculoskeletal negative musculoskeletal ROS (+)   Abdominal (+) + obese,   Peds  Hematology  (+) Blood dyscrasia, anemia ,   Anesthesia Other Findings   Reproductive/Obstetrics (+) Pregnancy                             Anesthesia Physical  Anesthesia Plan  ASA: III and emergent  Anesthesia Plan: Spinal   Post-op Pain Management:    Induction:   PONV Risk Score and Plan: 2 and Ondansetron and Dexamethasone  Airway Management Planned: Natural Airway and Nasal Cannula  Additional Equipment:   Intra-op Plan:   Post-operative Plan:   Informed Consent: I have reviewed the patients History and Physical, chart, labs and discussed the procedure including the risks, benefits and alternatives for the proposed anesthesia with the patient or authorized representative who has indicated his/her understanding and acceptance.     Plan Discussed with: CRNA and Surgeon  Anesthesia Plan Comments:         Anesthesia Quick Evaluation

## 2017-09-23 NOTE — Progress Notes (Signed)
Patient ID: Tina Lambert, female   DOB: [birth year], 32 y.o.   MRN: [id number]   MD Addendum Note  Hemoglobin is 7.2 will type and cross match 2 units and have 2 units ahead.  Will hang one unit in operating room  Sham Alviar, [facility] [name]

## 2017-09-23 NOTE — Anesthesia Preprocedure Evaluation (Deleted)
Anesthesia Evaluation  Patient identified by MRN, date of birth, ID band Patient awake    Reviewed: Allergy & Precautions, H&P , NPO status , Patient's Chart, lab work & pertinent test results, reviewed documented beta blocker date and time   Airway Mallampati: II  TM Distance: >3 FB Neck ROM: full    Dental no notable dental hx.    Pulmonary neg pulmonary ROS,    Pulmonary exam normal breath sounds clear to auscultation       Cardiovascular hypertension, negative cardio ROS Normal cardiovascular exam Rhythm:regular Rate:Normal     Neuro/Psych negative neurological ROS  negative psych ROS   GI/Hepatic negative GI ROS, Neg liver ROS,   Endo/Other  negative endocrine ROSMorbid obesity  Renal/GU negative Renal ROS  negative genitourinary   Musculoskeletal negative musculoskeletal ROS (+)   Abdominal   Peds  Hematology  (+) anemia ,   Anesthesia Other Findings   Reproductive/Obstetrics (+) Pregnancy                             Anesthesia Physical Anesthesia Plan  ASA: III and emergent  Anesthesia Plan: Spinal   Post-op Pain Management:    Induction:   PONV Risk Score and Plan:   Airway Management Planned:   Additional Equipment:   Intra-op Plan:   Post-operative Plan:   Informed Consent: I have reviewed the patients History and Physical, chart, labs and discussed the procedure including the risks, benefits and alternatives for the proposed anesthesia with the patient or authorized representative who has indicated his/her understanding and acceptance.     Plan Discussed with:   Anesthesia Plan Comments:         Anesthesia Quick Evaluation

## 2017-09-23 NOTE — Anesthesia Postprocedure Evaluation (Signed)
Anesthesia Post Note  Patient: Tina Lambert  Procedure(s) Performed: CESAREAN SECTION MULTI-GESTATIONAL WITH TUBAL (N/A Abdomen) CERCLAGE CERVICAL REMOVAL (N/A Cervix)     Patient location during evaluation: PACU Anesthesia Type: Spinal Level of consciousness: oriented and awake and alert Pain management: pain level controlled Vital Signs Assessment: post-procedure vital signs reviewed and stable Respiratory status: spontaneous breathing, respiratory function stable and nonlabored ventilation Cardiovascular status: blood pressure returned to baseline and stable Postop Assessment: no headache, no backache, no apparent nausea or vomiting, spinal receding and patient able to bend at knees Anesthetic complications: no Comments: Transfused 2nd unit of PRBC's in PACU.     Last Vitals:  Vitals:   09/23/17 1602 09/23/17 1603  BP:    Pulse:    Resp: 18 18  Temp:    SpO2:      Last Pain:  Vitals:   09/23/17 1603  TempSrc:   PainSc: 3    Pain Goal:                 Lavaughn Bisig A.

## 2017-09-23 NOTE — Op Note (Signed)
Tina Lambert  DOB:    May 07, 1985  MRN:    [id number]  Date of Surgery:  09/23/2017  Indication: 32 year old 736P4 Di/Di Twin pregnancy at 33 weeks 2 days admitted for preeclampsia with severe features.  Patient noted to have mild to severe range BPs with neurologic symptoms to include blurry vision, scotomata and severe headache.  Both babies noted to be transverse lie on ultrasound.   Pre-operative Diagnosis: 1. Judi Congi / Di Twin Pregnancy Transverse lie of both babies                                               2. Preeclampsia with severe features                                               3. Desired permanent sterilization  Post-operative Diagnosis: same  Procedure:  1. Primary Tina delivery of Twins 2. Bilateral salpingectomy 3. Cerclage removal                         Surgeon: Wynonia HazardPINN, Jeanean Hollett [name], MD  Assistants: Kathalene FramesGREER, ELLIS, CNM  Anesthesia: Spinal anesthesia  ASA Class: 2  Procedure Details   The patient was counseled about the risks, benefits, complications of the Tina section. The patient concurred with the proposed plan, giving informed consent.   The patient was taken to Operating Room # 9, identified as Tina Lambert and the procedure verified as C-Section Delivery. A Time Out was held and the above information confirmed.  After the spinal anesthesia was found to adequate , the patient was placed in the dorsal supine position with a leftward tilt, draped and prepped in the usual sterile manner. A Pfannenstiel incision was made with a 10 blade scalpel and the incision carried down through the subcutaneous tissue to the fascia with the Bovie.   The fascia was incised in the midline and the fascial incision was extended laterally with Mayo scissors. The superior aspect of the fascial incision was grasped with Coker clamps x2, tented up and the rectus muscles dissected off sharply with the bovie.  The rectus was then dissected off  with blunt dissection and the bovie inferiorly. The rectus muscles were separated in the midline. The abdominal peritoneum was identified, and bluntly entered using surgeons fingers. The peritoneal opening was bluntly extended with gentle pulling.  The Alexis retractor was then deployed. The vesicouterine peritoneum was identified, tented up, entered sharply with Metzenbaum scissors, and the bladder flap was created digitally. Scalpel was then used to make a low transverse incision on the uterus which was extended laterally with  blunt dissection.Baby A fetus was moved to vertex position from transverse lie with head to maternal left and the vertex was delivered with gentle fundal pressure.  A live healthy Female with Apgar scores of 9 at one minute and 10 at five minutes. After the umbilical cord was clamped and cut, the baby was handed off to waiting Pediatricians. Cord gases and cord blood was obtained for evaluation.  Baby B was delivered through anterior placenta of baby A.  The feet were grabbed and a breech extraction was performed.  The body was delivered until the shoulders.  Each arm was delivered via Pinard maneuver. The head was delivered via Domenic SchwabMariceau, [place], [place] maneuver. The cord of Baby B was double clamped and cut and the infant handed over to the waiting pediatricians.  A live female was born Apgar scores of 3 at one minute and 9 at five minutes. Cord gases and cord blood was obtained for baby B. Both placentas were removed intact manually with trailing membranes.  The placentas was handed off to be sent as a specimen for Pathology.   The uterus was cleared of all clot and debris. The uterine incision was repaired with #0 Monocryl in running locked fashion. A second imbricating suture was performed using the same suture. The incision was hemostatic. Ovaries and tubes were inspected and normal. The Ligasure impact was used to cauterize the fallopian tube from the uterus along the mesosalpinx and  ligated off at the level of the cornua on either side.  Both tubal segments were handed off to be evaluated by Pathology. The Alexis retractor was removed. The abdominal cavity was cleared of all clot and debris. The abdominal peritoneum was reapproximated with 2-0 chromic  in a running fashion, the rectus muscles was reapproximated with #2 chromic in interrupted fashion. The fascia was closed with 0 Vicryl in a running fashion. The subcuticular layer was irrigated and all bleeders cauterized.  30 mL of 0.5% Marcaine was injected into the subcutaneous layer.  The Scarpas fascia was re-approximated with interrupted sutures of 2-0 plain.   The skin was closed with 4-0 vicryl in a subcuticular fashion using a Mellody DanceKeith needle. The incision was dressed with benzoine, steri strips and pressure dressing. All sponge lap and needle counts were correct x3.   Patient was placed in [place] stirrups and an operative Graves speculum was placed into the vagina.  The Posterior aspect of the [employer] cerclage stitch was grasped with long forceps and cut. The cerclage stitch was removed without difficulty.  Patient tolerated the procedure well and recovered in stable condition following the procedure.  Instrument, sponge, and needle counts were correct prior the abdominal closure and at the conclusion of the case.   Findings: 1. A: Live female infant, Apgars 9/10, clear amniotic fluid, placenta normal 2. B: Live female infant Apgars 3/10, 3.  normal uterus, bilateral tubes and ovaries  Estimated Blood Loss: 803mL  IVF:  500 mL LR  PRBCs:  1Unit         Drains: Foley catheter  Urine output: 200 mL clear         Specimens: Placenta to Pathology; Bilateral fallopian tubes         Implants: none         Complications:  None; patient tolerated the procedure well.         Disposition: PACU - hemodynamically stable.   Kimyata Milich [name]

## 2017-09-24 ENCOUNTER — Other Ambulatory Visit: Payer: Self-pay

## 2017-09-24 ENCOUNTER — Encounter (HOSPITAL_COMMUNITY): Payer: Self-pay | Admitting: Obstetrics & Gynecology

## 2017-09-24 LAB — COMPREHENSIVE METABOLIC PANEL
ALT: 80 U/L — AB (ref 0–44)
AST: 93 U/L — ABNORMAL HIGH (ref 15–41)
Albumin: 2.1 g/dL — ABNORMAL LOW (ref 3.5–5.0)
Alkaline Phosphatase: 198 U/L — ABNORMAL HIGH (ref 38–126)
Anion gap: 8 (ref 5–15)
BILIRUBIN TOTAL: 1.4 mg/dL — AB (ref 0.3–1.2)
BUN: <5 mg/dL — ABNORMAL LOW (ref 6–20)
CHLORIDE: 101 mmol/L (ref 98–111)
CO2: 23 mmol/L (ref 22–32)
CREATININE: 0.61 mg/dL (ref 0.44–1.00)
Calcium: 6.8 mg/dL — ABNORMAL LOW (ref 8.9–10.3)
Glucose, Bld: 120 mg/dL — ABNORMAL HIGH (ref 70–99)
POTASSIUM: 3.9 mmol/L (ref 3.5–5.1)
SODIUM: 132 mmol/L — AB (ref 135–145)
TOTAL PROTEIN: 4.9 g/dL — AB (ref 6.5–8.1)

## 2017-09-24 LAB — CBC
HCT: 23.8 % — ABNORMAL LOW (ref 36.0–46.0)
HEMOGLOBIN: 7.5 g/dL — AB (ref 12.0–15.0)
MCH: 22.3 pg — ABNORMAL LOW (ref 26.0–34.0)
MCHC: 31.5 g/dL (ref 30.0–36.0)
MCV: 70.8 fL — ABNORMAL LOW (ref 78.0–100.0)
Platelets: 264 10*3/uL (ref 150–400)
RBC: 3.36 MIL/uL — AB (ref 3.87–5.11)
RDW: 22.3 % — ABNORMAL HIGH (ref 11.5–15.5)
WBC: 9.2 10*3/uL (ref 4.0–10.5)

## 2017-09-24 MED ORDER — SODIUM CHLORIDE 0.9 % IV SOLN
510.0000 mg | Freq: Once | INTRAVENOUS | Status: AC
  Administered 2017-09-24: 510 mg via INTRAVENOUS
  Filled 2017-09-24: qty 17

## 2017-09-24 NOTE — Progress Notes (Addendum)
Post Partum Day 1 Subjective: no complaints, up ad lib, voiding, tolerating PO and + flatus. Not pumping breastmilk at this time. Currently FF. Babies are in ICN, but doing well. She is happy they are all alive and well now. She is very frustrated about her care prior to arriving here - concerned about her BP not being adequately treated. She has no HA, blurred vision, or RUQ pain today. No CP or SOB. She feels well and is looking forward to Mag getting discontinued. She has SCDs on her legs.   Objective: Blood pressure 112/77, pulse 89, temperature 97.8 F (36.6 C), temperature source Oral, resp. rate 19, height 5\' 3"  (1.6 m), weight 97.5 kg, last menstrual period 02/02/2017, SpO2 100 %, unknown if currently breastfeeding.  Physical Exam:  General: alert, cooperative, no distress and morbidly obese Lochia: appropriate Uterine Fundus: firm Incision: no significant drainage, no significant erythema; honeycomb in place. Area of drainage on left side of dressing - serosanguinous fluid, small amount.  DVT Evaluation: No cords or calf tenderness. No significant calf/ankle edema.  Recent Labs    09/23/17 1631 09/24/17 0646  HGB 8.1* 7.5*  HCT 25.8* 23.8*   Results for Peri MarisHOMPSON, Marguerite (MRN [id number]) as of 09/24/2017 12:49  Ref. Range 09/24/2017 06:46  Platelets Latest Ref Range: 150 - 400 K/uL 264   Results for Peri MarisHOMPSON, Tiana (MRN [id number]) as of 09/24/2017 12:49  Ref. Range 09/24/2017 06:46  AST Latest Ref Range: 15 - 41 U/L 93 (H)  ALT Latest Ref Range: 0 - 44 U/L 80 (H)   AST/ALT 8/17 0100 @ [place] 41/86 plt 8/17 0100 @ [place] 289k  Assessment/Plan: Social Work consult  2) Recommend LMWH for DVT prophylaxis given immobility, s/p cesarean birth, and morbid obesity 3) Preeclampsia - Asymptomatic. will d/c magnesium at 24h. BP is stable. Does not currently need BP treatment. She has not needed BP medication since arriving. Will closely monitor and start procardia prn. Tox  panel was repeated this AM. Plt normal. AST/ALT are double consistent with severe features of pre-eclampsia. Will repeat labs in AM or sooner prn.  4) severe anemia - recommend Fe supplementation. Consider IV feraheme infusions x2. She is asymptomatic and her past Hgb have been 7-8 so I do not think she needs blood transfusion. Will discuss with Dr Normand Sloopillard.     LOS: 1 day   Faylene MillionKathleen A Page 09/24/2017, 12:42 PM    Pt seen agree with above.  Will give iv iron

## 2017-09-24 NOTE — Lactation Note (Addendum)
This note was copied from a baby's chart. Lactation Consultation Note  Patient Name: Cecille AverBoyA Marylou Chrestman ZOXWR'UToday's Date: 09/24/2017   Twins 23 hours old in NICU.  2124w3d.  Ex BF.  1.5 years. Mother has pumped set up in her room but states she has not pumped yet. Offered to assist her w/ pumping but mother declined stating she would like to eat her lunch first. Reviewed how pump works and recommend pumping q 2.5-3 hours. Reviewed hand expression with drops expressed. Provided mother w/ NICU booklet to read, colostrum containers and labels. Mom made aware of O/P services, breastfeeding support groups, community resources, and our phone # for post-discharge questions.       Maternal Data    Feeding    LATCH Score                   Interventions    Lactation Tools Discussed/Used     Consult Status      Hardie PulleyBerkelhammer, Zyra Parrillo Boschen 09/24/2017, 12:50 PM

## 2017-09-24 NOTE — Plan of Care (Signed)
Pt. Condition will continue to improve 

## 2017-09-24 NOTE — Addendum Note (Signed)
Addendum  created 09/24/17 0818 by Berman Grainger H, CRNA   Sign clinical note    

## 2017-09-24 NOTE — Anesthesia Postprocedure Evaluation (Signed)
Anesthesia Post Note  Patient: Tina Lambert  Procedure(s) Performed: CESAREAN SECTION MULTI-GESTATIONAL WITH TUBAL (N/A Abdomen) CERCLAGE CERVICAL REMOVAL (N/A Cervix)     Patient location during evaluation: [facility] Unit Anesthesia Type: Spinal Level of consciousness: awake and alert Pain management: pain level controlled Vital Signs Assessment: post-procedure vital signs reviewed and stable Respiratory status: spontaneous breathing, nonlabored ventilation and respiratory function stable Cardiovascular status: stable Postop Assessment: no headache, no backache, spinal receding, patient able to bend at knees, no apparent nausea or vomiting, able to ambulate and adequate PO intake Anesthetic complications: no    Last Vitals:  Vitals:   09/24/17 0619 09/24/17 0759  BP: 123/89 131/78  Pulse: 97 87  Resp: 19 18  Temp: 37.1 C 37.3 C  SpO2: 100% 98%    Last Pain:  Vitals:   09/24/17 0759  TempSrc: Oral  PainSc:    Pain Goal: Patients Stated Pain Goal: 3 (09/24/17 0600)               Reinhardt Licausi Hristova

## 2017-09-25 ENCOUNTER — Encounter (HOSPITAL_COMMUNITY): Payer: Self-pay

## 2017-09-25 ENCOUNTER — Ambulatory Visit (HOSPITAL_COMMUNITY)
Admission: RE | Admit: 2017-09-25 | Discharge: 2017-09-25 | Disposition: A | Discharge status: Home / Self Care | Payer: Medicaid Other | Source: Ambulatory Visit | Attending: Obstetrics & Gynecology | Admitting: Obstetrics & Gynecology

## 2017-09-25 LAB — COMPREHENSIVE METABOLIC PANEL
ALBUMIN: 2.2 g/dL — AB (ref 3.5–5.0)
ALT: 109 U/L — AB (ref 0–44)
AST: 132 U/L — ABNORMAL HIGH (ref 15–41)
Alkaline Phosphatase: 369 U/L — ABNORMAL HIGH (ref 38–126)
Anion gap: 9 (ref 5–15)
BUN: <5 mg/dL — ABNORMAL LOW (ref 6–20)
CHLORIDE: 104 mmol/L (ref 98–111)
CO2: 22 mmol/L (ref 22–32)
CREATININE: 0.57 mg/dL (ref 0.44–1.00)
Calcium: 7.9 mg/dL — ABNORMAL LOW (ref 8.9–10.3)
GFR calc non Af Amer: >60 mL/min (ref >60)
GLUCOSE: 88 mg/dL (ref 70–99)
Potassium: 4.2 mmol/L (ref 3.5–5.1)
SODIUM: 135 mmol/L (ref 135–145)
Total Bilirubin: 1.5 mg/dL — ABNORMAL HIGH (ref 0.3–1.2)
Total Protein: 5 g/dL — ABNORMAL LOW (ref 6.5–8.1)

## 2017-09-25 LAB — CBC
HCT: 23.8 % — ABNORMAL LOW (ref 36.0–46.0)
Hemoglobin: 7.6 g/dL — ABNORMAL LOW (ref 12.0–15.0)
MCH: 22.7 pg — AB (ref 26.0–34.0)
MCHC: 31.9 g/dL (ref 30.0–36.0)
MCV: 71 fL — ABNORMAL LOW (ref 78.0–100.0)
PLATELETS: 261 10*3/uL (ref 150–400)
RBC: 3.35 MIL/uL — ABNORMAL LOW (ref 3.87–5.11)
RDW: 22.2 % — ABNORMAL HIGH (ref 11.5–15.5)
WBC: 8.7 10*3/uL (ref 4.0–10.5)

## 2017-09-25 LAB — PREPARE RBC (CROSSMATCH)

## 2017-09-25 MED ORDER — ACETAMINOPHEN 325 MG PO TABS
650.0000 mg | ORAL_TABLET | Freq: Once | ORAL | Status: AC
  Administered 2017-09-25: 650 mg via ORAL
  Filled 2017-09-25: qty 2

## 2017-09-25 MED ORDER — DIPHENHYDRAMINE HCL 25 MG PO CAPS
25.0000 mg | ORAL_CAPSULE | Freq: Once | ORAL | Status: AC
  Administered 2017-09-25: 25 mg via ORAL
  Filled 2017-09-25: qty 1

## 2017-09-25 MED ORDER — SODIUM CHLORIDE 0.9% IV SOLUTION
Freq: Once | INTRAVENOUS | Status: AC
  Administered 2017-09-25: 14:00:00 via INTRAVENOUS

## 2017-09-25 NOTE — Progress Notes (Signed)
IV occluded when starting blood, 22g restarted promptly to continue blood without interuption

## 2017-09-25 NOTE — Progress Notes (Signed)
Subjective: Postpartum Day 2: Cesarean Delivery of Di/Di Twins for severe preeclampsia Patient feeling much better. She denies headache, blurry vision. She does  Have RUQ discomfort.  Her incisional pain is controlled with tylenol and Motrin.  She denies nausea or vomiting and is tolerating po.  She is voiding without  Difficulty and passing flatus.    Objective: Vital signs in last 24 hours: Temp:  [97.8 F (36.6 C)-98.9 F (37.2 C)] 98.5 F (36.9 C) (08/20 0837) Pulse Rate:  [60-92] 60 (08/20 0837) Resp:  [16-19] 16 (08/20 0837) BP: (112-154)/(74-95) 144/74 (08/20 0837) SpO2:  [98 %-100 %] 100 % (08/20 0837)  Physical Exam:  General: alert, cooperative and no distress Lochia: appropriate Uterine Fundus: firm Incision: healing well, no dehiscence, no significant erythema, honeycomb dressing with small  Area of serosanguinous drainage DVT Evaluation: No evidence of DVT seen on physical exam. Negative Homan's sign. No cords or calf tenderness.  Recent Labs    09/24/17 0646 09/25/17 0618  HGB 7.5* 7.6*  HCT 23.8* 23.8*    Assessment/Plan: POD#2 Status post Cesarean section. Doing well postoperatively.   Cards: -Blood pressures mild range no signs or symptoms of post partum severe preeclampsia -S/p magnesium sulfate puerperium - will have patient f/u for blood pressure check next week.   Heme: - Patient with severe anemia, she is s/p 2 units pRBCs and one dose of IV iron today.  - Discussed with patient boosting her hemoglobin with transfusion of pRBCs,    as she is still having some vaginal bleeding / lochia. Will administer 2 more units and she agrees.   GI: - Cholelithiasis - Patient to have close follow up with Gastroenterology and General surgery to schedule her    Cholecystectomy. - LFTs have increased this morning. Discussed with patient that it may be caused by her cholelithiasis versus Still preeclampsia.  Will keep her hospitalized until tomorrow. -  Tolerating PO will continue low fat regular diet  Disposition: - Patient pumping, no milk production yet -babies in NICU doing well -most likely to be discharged tomorrow  Essie HartINN, Mikai Meints [facility]AC[facility] 09/25/2017, 11:24 AM

## 2017-09-25 NOTE — Lactation Note (Signed)
This note was copied from a baby's chart. Lactation Consultation Note: Follow up visit with this experienced BF mom of twins born at 33.2 Tina Lambert in the NICU. Mom reports she pumped about 4 times yesterday. Obtaining small amounts. Encouragement given. Reviewed importance of frequent pumping at least 8 times/day No questions at present. [facility]   Patient Name: Tina Lambert LKGMW'NToday's Date: 09/25/2017 Reason for consult: Follow-up assessment;Multiple gestation;Preterm <34wks   Maternal Data Has patient been taught Hand Expression?: Yes Does the patient have breastfeeding experience prior to this delivery?: Yes  Feeding Feeding Type: Donor Breast Milk  LATCH Score                   Interventions    Lactation Tools Discussed/Used WIC Program: Yes   Consult Status Consult Status: PRN    Pamelia HoitWeeks, Alysia Scism D 09/25/2017, 8:53 AM

## 2017-09-26 ENCOUNTER — Ambulatory Visit: Payer: Self-pay

## 2017-09-26 LAB — BPAM RBC
BLOOD PRODUCT EXPIRATION DATE: 201909072359
BLOOD PRODUCT EXPIRATION DATE: 201909232359
Blood Product Expiration Date: 201909152359
Blood Product Expiration Date: 201909282359
ISSUE DATE / TIME: 201908181217
ISSUE DATE / TIME: 201908181404
ISSUE DATE / TIME: 201908201411
ISSUE DATE / TIME: 201908201654
UNIT TYPE AND RH: 5100
UNIT TYPE AND RH: 5100
Unit Type and Rh: 5100
Unit Type and Rh: 5100

## 2017-09-26 LAB — TYPE AND SCREEN
ABO/RH(D): O POS
Antibody Screen: NEGATIVE
UNIT DIVISION: 0
UNIT DIVISION: 0
Unit division: 0
Unit division: 0

## 2017-09-26 LAB — CBC WITH DIFFERENTIAL/PLATELET
BASOS PCT: 1 %
Basophils Absolute: 0.1 10*3/uL (ref 0.0–0.1)
Eosinophils Absolute: 0.8 10*3/uL — ABNORMAL HIGH (ref 0.0–0.7)
Eosinophils Relative: 8 %
HEMATOCRIT: 29.9 % — AB (ref 36.0–46.0)
Hemoglobin: 9.6 g/dL — ABNORMAL LOW (ref 12.0–15.0)
LYMPHS ABS: 2.4 10*3/uL (ref 0.7–4.0)
LYMPHS PCT: 24 %
MCH: 24.7 pg — AB (ref 26.0–34.0)
MCHC: 32.1 g/dL (ref 30.0–36.0)
MCV: 76.9 fL — AB (ref 78.0–100.0)
MONO ABS: 0.4 10*3/uL (ref 0.1–1.0)
MONOS PCT: 4 %
NEUTROS ABS: 6.5 10*3/uL (ref 1.7–7.7)
NEUTROS PCT: 63 %
Platelets: 242 10*3/uL (ref 150–400)
RBC: 3.89 MIL/uL (ref 3.87–5.11)
RDW: 24.3 % — ABNORMAL HIGH (ref 11.5–15.5)
WBC: 10.2 10*3/uL (ref 4.0–10.5)

## 2017-09-26 MED ORDER — FERROUS SULFATE 325 (65 FE) MG PO TABS: 325.0000 mg | ORAL_TABLET | Freq: Every day | ORAL | 3 refills | Status: AC

## 2017-09-26 MED ORDER — NIFEDIPINE ER OSMOTIC RELEASE 30 MG PO TB24
30.0000 mg | ORAL_TABLET | Freq: Every day | ORAL | Status: DC
  Administered 2017-09-26: 30 mg via ORAL
  Filled 2017-09-26: qty 1

## 2017-09-26 MED ORDER — OXYCODONE HCL 5 MG PO TABS: 5.0000 mg | ORAL_TABLET | Freq: Four times a day (QID) | ORAL | 0 refills | Status: AC | PRN

## 2017-09-26 MED ORDER — IBUPROFEN 600 MG PO TABS: 600.0000 mg | ORAL_TABLET | Freq: Four times a day (QID) | ORAL | 0 refills | Status: AC | PRN

## 2017-09-26 MED ORDER — NIFEDIPINE ER 30 MG PO TB24: 30.0000 mg | ORAL_TABLET | Freq: Every day | ORAL | 3 refills | Status: AC

## 2017-09-26 NOTE — Discharge Summary (Addendum)
OB Discharge Summary     Patient Name: Tina Lambert DOB: [birth year] MRN: [id number]  Date of admission: 09/23/2017 Delivering MD:    Lovey Newcomerhompson, BoyA ClintonBrittany [[id number]][[id number]]  Essie HartINN, WALDA   Cheral Almashompson, BoyB [place]Brittany [[id number]][[id number]]  Essie HartPINN, WALDA   Date of discharge: 09/26/2017  Admitting diagnosis: PREG Intrauterine pregnancy: 5135w2d     Secondary diagnosis:  Active Problems:   Twin pregnancy in third trimester   Genital herpes simplex type 2   Anemia   Incompetent cervix in pregnancy   Cholelithiasis affecting pregnancy in third trimester, antepartum   Preeclampsia, severe, third trimester     Discharge diagnosis: Preterm Pregnancy Delivered, Preeclampsia (severe) and Anemia                                                                                                Post partum procedures:blood transfusion  Augmentation: n/a  Complications: None  Hospital course:  Sceduled C/S   32 y.o. yo [id number] at 735w2d was admitted to the hospital 09/23/2017 for scheduled cesarean section with the following indication:Malpresentation and severe preeclampsia.  Membrane Rupture Time/Date:    Cecille Averhompson, BoyA Valissa [[id number]][[id number]]  12:31 PM   Cheral Almashompson, BoyB [place]Brittany [[id number]][[id number]]    ,   Lovey Newcomerhompson, BoyA [place] CityBrittany [[id number]][[id number]]  09/23/2017   Cheral Almashompson, BoyB [place]ittany [[id number]][[id number]]  09/23/2017   Patient delivered two Viable infants.   Lovey Newcomerhompson, BoyA [place] [[id number]][[id number]]  09/23/2017   Cheral Almashompson, BoyB [place]Brittany [[id number]][[id number]]  09/23/2017  Details of operation can be found in separate operative note.  Pateint had an uncomplicated postpartum course.  She is ambulating, tolerating a regular diet, passing flatus, and urinating well. Patient had had a bowel movement prior to discharge. She denies any symptoms of preeclampsia including: headache, vision changes, or epigastric pain. Consulted Dr. Estanislado Pandyivard regarding patient's blood pressures, she verbalized patient could discharge today if she is started on  Procardia XL 30mg  QD this morning and comes to [facility] for an outpatient visit in 2 days for a blood pressure check. Patient verbalized understanding and is discharged home in stable condition on  09/26/17         Physical exam  Vitals:   09/25/17 2011 09/26/17 0001 09/26/17 0429 09/26/17 0835  BP: (!) 150/95 (!) 155/92 135/79 (!) 150/93  Pulse: 61 72 65 (!) 55  Resp: 16 16 18 18   Temp: 99.3 F (37.4 C) 98.8 F (37.1 C) 98.8 F (37.1 C) 98 F (36.7 C)  TempSrc: Oral Oral Oral Oral  SpO2: 100% 99% 98% 99%  Weight:      Height:       General: alert, cooperative and no distress Lochia: appropriate Uterine Fundus: firm Incision: Healing well with no significant drainage, No significant erythema, Dressing is clean, dry, and intact DVT Evaluation: No evidence of DVT seen on physical exam. Negative Homan's sign. No cords or calf tenderness. No significant calf/ankle edema. Labs: Lab Results  Component Value Date   WBC 10.2 09/26/2017   HGB 9.6 (L) 09/26/2017   HCT 29.9 (L) 09/26/2017   MCV 76.9 (L) 09/26/2017   PLT 242 09/26/2017   CMP Latest Ref Rng &  Units 09/25/2017  Glucose 70 - 99 mg/dL 88  BUN 6 - 20 mg/dL <5(W<5(L)  Creatinine 0.980.44 - 1.00 mg/dL 1.190.57  Sodium 147135 - 829145 mmol/L 135  Potassium 3.5 - 5.1 mmol/L 4.2  Chloride 98 - 111 mmol/L 104  CO2 22 - 32 mmol/L 22  Calcium 8.9 - 10.3 mg/dL 7.9(L)  Total Protein 6.5 - 8.1 g/dL 5.0(L)  Total Bilirubin 0.3 - 1.2 mg/dL 5.6(O1.5(H)  Alkaline Phos 38 - 126 U/L 369(H)  AST 15 - 41 U/L 132(H)  ALT 0 - 44 U/L 109(H)    Discharge instruction: per After Visit Summary and "Baby and Me Booklet". Patient discharged with preeclampsia precautions.   After visit meds:  Allergies as of 09/26/2017   No Known Allergies     Medication List    STOP taking these medications   MAKENA IM   NIFEdipine 20 MG capsule Commonly known as:  PROCARDIA Replaced by:  NIFEdipine 30 MG 24 hr tablet   promethazine 12.5 MG tablet Commonly known as:   PHENERGAN     TAKE these medications   famotidine 20 MG tablet Commonly known as:  PEPCID Take 1 tablet (20 mg total) by mouth 2 (two) times daily.   ferrous sulfate 325 (65 FE) MG tablet Take 1 tablet (325 mg total) by mouth daily with breakfast. What changed:  when to take this   flintstones complete 60 MG chewable tablet Chew 2 tablets by mouth daily.   ibuprofen 600 MG tablet Commonly known as:  ADVIL,MOTRIN Take 1 tablet (600 mg total) by mouth every 6 (six) hours as needed for mild pain or moderate pain.   NIFEdipine 30 MG 24 hr tablet Commonly known as:  PROCARDIA-XL/ADALAT CC Take 1 tablet (30 mg total) by mouth daily. Replaces:  NIFEdipine 20 MG capsule   oxyCODONE 5 MG immediate release tablet Commonly known as:  Oxy IR/ROXICODONE Take 1 tablet (5 mg total) by mouth every 6 (six) hours as needed for up to 7 days for severe pain.   valACYclovir 1000 MG tablet Commonly known as:  VALTREX Take 500 mg by mouth 2 (two) times daily.       Diet: routine diet  Activity: Advance as tolerated. Pelvic rest for 6 weeks.   Outpatient follow up:2 days for blood pressure check, 1 week for blood pressure check and incision check, and 6 weeks for postpartum visit Follow up Appt: Future Appointments  Date Time Provider Department Center  09/27/2017 11:00 AM Johney MaineKale, Gautam Kishore, MD [facility] None   Follow up Visit:No follow-ups on file.  Postpartum contraception: Tubal Ligation  Newborn Data:   Lovey Newcomerhompson, BoyA [place] [[id number]][[id number]]  Live born female  Birth Weight: 4 lb 5.1 oz (1960 g) APGAR: 9, 10  Newborn Delivery   Birth date/time:  09/23/2017 12:32:00 Delivery type:  C-Section, Low Transverse Trial of labor:  No C-section categorization:  Primary      Jeb Leveringhompson, BoyB Judeth [[id number]][[id number]]  Live born female  Birth Weight: 4 lb 2.3 oz (1880 g) APGAR: 3, 9  Newborn Delivery   Birth date/time:  09/23/2017 12:35:00 Delivery type:  C-Section, Low Transverse Trial  of labor:  No C-section categorization:  Primary     Baby Feeding: Breast Disposition:NICU   09/26/2017 Janeece RiggersEllis K Greer, CNM

## 2017-09-26 NOTE — Lactation Note (Signed)
This note was copied from a baby's chart. Lactation Consultation Note: Mother has been pumping with DEBP every 2-3 hours for 33.2 week twins in the NICU. Mother is active with WIC. [facility]   Mother was given a harmony hand pump with a #27 flange. Encouraged mother to continue to pump every 2-3 hours for 15 mins on each breast.   Discussed treatment and prevention of engorgement.  [facility] for questions or concerns.  Mother reminded of pumping room in the NICU. Reviewed collection, storage and transporting ebm to NICU. Mother receptive to all teaching.   Patient Name: Tina Lambert ZOXWR'UToday's Date: 09/26/2017 Reason for consult: Follow-up assessment   Maternal Data    Feeding Feeding Type: Donor Breast Milk  LATCH Score                   Interventions Interventions: Hand pump  Lactation Tools Discussed/Used WIC Program: Yes   Consult Status Consult Status: Complete    Michel BickersKendrick, Serra Younan McCoy 09/26/2017, 12:52 PM

## 2017-09-26 NOTE — Discharge Instructions (Signed)
Postpartum Care After Cesarean Delivery °The period of time right after you deliver your newborn is called the postpartum period. °What kind of medical care will I receive? °· You may continue to receive fluids and medicines through an IV tube inserted into one of your veins. °· You may have small, flexible tube (catheter) draining urine from your bladder into a bag outside of your body. The catheter will be removed as soon as possible. °· You may be given a squirt bottle to use when you go to the bathroom. You may use this until you are comfortable wiping as usual. To use the squirt bottle, follow these steps: °? Before you urinate, fill the squirt bottle with warm water. The water should be warm. Do not use hot water. °? After you urinate, while you are sitting on the toilet, use the squirt bottle to rinse the area around your urethra and vaginal opening. This rinses away any urine and blood. °? You may do this instead of wiping. As you start healing, you may use the squirt bottle before wiping yourself. Make sure to wipe gently. °? Fill the squirt bottle with clean water every time you use the bathroom. °· You will be given sanitary pads to wear. °· Your incision will be monitored to make sure it is healing properly. You will be told when it is safe for your stitches, staples, or skin adhesive tape to be removed. °What can I expect? °· You may not feel the need to urinate for several hours after delivery. °· You will have some soreness and pain in your abdomen. You may have a small amount of blood or clear fluid coming from your incision. °· If you are breastfeeding, you may have uterine contractions every time you breastfeed for up to several weeks postpartum. Uterine contractions help your uterus return to its normal size. °· It is normal to have vaginal bleeding (lochia) after delivery. The amount and appearance of lochia is often similar to a menstrual period in the first week after delivery. It will  gradually decrease over the next few weeks to a dry, yellow-brown discharge. For most women, lochia stops completely by 6-8 weeks after delivery. Vaginal bleeding can vary from woman to woman. °· Within the first few days after delivery, you may have breast engorgement. This is when your breasts feel heavy, full, and uncomfortable. Your breasts may also throb and feel hard, tightly stretched, warm, and tender. After this occurs, you may have milk leaking from your breasts. Your health care provider can help you relieve discomfort due to breast engorgement. Breast engorgement should go away within a few days. °· You may feel more sad or worried than normal due to hormonal changes after delivery. These feelings should not last more than a few days. If these feelings do not go away after several days, speak with your health care provider. °How should I care for myself? °· Tell your health care provider if you have pain or discomfort. °· Drink enough water to keep your urine clear or pale yellow. °· Wash your hands thoroughly with soap and water for at least 20 seconds after changing your sanitary pads or using the toilet, and before holding or feeding your baby. °· If you are not breastfeeding, avoid touching your breasts a lot. Doing this can make your breasts produce more milk. °· If you become weak or lightheaded, or you feel like you might faint, ask for help before: °? Getting out of bed. °? Showering. °·   Change your sanitary pads frequently. Watch for any changes in your flow, such as a sudden increase in volume, a change in color, or the passing of large blood clots. If you pass a blood clot from your vagina, save it to show to your health care provider. Do not flush blood clots down the toilet without having your health care provider look at them. °· Make sure that all your vaccinations are up to date. This can help protect you and your baby from getting certain diseases. You may need to have immunizations done  before you leave the hospital. °· If desired, talk with your health care provider about methods of family planning or birth control (contraception). °How can I start bonding with my baby? °Spending as much time as possible with your baby is very important. During this time, you and your baby can get to know each other and develop a bond. Having your baby stay with you in your room (rooming in) can give you time to get to know your baby. Rooming in can also help you become comfortable caring for your baby. Breastfeeding can also help you bond with your baby. °How can I plan for returning home with my baby? °· Make sure that you have a car seat installed in your vehicle. °? Your car seat should be checked by a certified car seat installer to make sure that it is installed safely. °? Make sure that your baby fits into the car seat safely. °· Ask your health care provider any questions you have about caring for yourself or your baby. Make sure that you are able to contact your health care provider with any questions after leaving the hospital. °This information is not intended to replace advice given to you by your health care provider. Make sure you discuss any questions you have with your health care provider. °Document Released: 10/18/2011 Document Revised: 06/28/2015 Document Reviewed: 12/28/2014 °Elsevier Interactive Patient Education © 2018 [employer] ° ° °Postpartum Depression and Baby Blues °The postpartum period begins right after the birth of a baby. During this time, there is often a great amount of joy and excitement. It is also a time of many changes in the life of the parents. Regardless of how many times a mother gives birth, each child brings new challenges and dynamics to the family. It is not unusual to have feelings of excitement along with confusing shifts in moods, emotions, and thoughts. All mothers are at risk of developing postpartum depression or the "baby blues." These mood changes can occur  right after giving birth, or they may occur many months after giving birth. The baby blues or postpartum depression can be mild or severe. Additionally, postpartum depression can go away rather quickly, or it can be a long-term condition. °What are the causes? °Raised hormone levels and the rapid drop in those levels are thought to be a main cause of postpartum depression and the baby blues. A number of hormones change during and after pregnancy. Estrogen and progesterone usually decrease right after the delivery of your baby. The levels of thyroid hormone and various cortisol steroids also rapidly drop. Other factors that play a role in these mood changes include major life events and genetics. °What increases the risk? °If you have any of the following risks for the baby blues or postpartum depression, know what symptoms to watch out for during the postpartum period. Risk factors that may increase the likelihood of getting the baby blues or postpartum depression include: °·   Having a personal or family history of depression. °· Having depression while being pregnant. °· Having premenstrual mood issues or mood issues related to oral contraceptives. °· Having a lot of life stress. °· Having marital conflict. °· Lacking a social support network. °· Having a baby with special needs. °· Having health problems, such as diabetes. ° °What are the signs or symptoms? °Symptoms of baby blues include: °· Brief changes in mood, such as going from extreme happiness to sadness. °· Decreased concentration. °· Difficulty sleeping. °· Crying spells, tearfulness. °· Irritability. °· Anxiety. ° °Symptoms of postpartum depression typically begin within the first month after giving birth. These symptoms include: °· Difficulty sleeping or excessive sleepiness. °· Marked weight loss. °· Agitation. °· Feelings of worthlessness. °· Lack of interest in activity or food. ° °Postpartum psychosis is a very serious condition and can be  dangerous. Fortunately, it is rare. Displaying any of the following symptoms is cause for immediate medical attention. Symptoms of postpartum psychosis include: °· Hallucinations and delusions. °· Bizarre or disorganized behavior. °· Confusion or disorientation. ° °How is this diagnosed? °A diagnosis is made by an evaluation of your symptoms. There are no medical or lab tests that lead to a diagnosis, but there are various questionnaires that a health care provider may use to identify those with the baby blues, postpartum depression, or psychosis. Often, a screening tool called the Edinburgh Postnatal Depression Scale is used to diagnose depression in the postpartum period. °How is this treated? °The baby blues usually goes away on its own in 1-2 weeks. Social support is often all that is needed. You will be encouraged to get adequate sleep and rest. Occasionally, you may be given medicines to help you sleep. °Postpartum depression requires treatment because it can last several months or longer if it is not treated. Treatment may include individual or group therapy, medicine, or both to address any social, physiological, and psychological factors that may play a role in the depression. Regular exercise, a healthy diet, rest, and social support may also be strongly recommended. °Postpartum psychosis is more serious and needs treatment right away. Hospitalization is often needed. °Follow these instructions at home: °· Get as much rest as you can. Nap when the baby sleeps. °· Exercise regularly. Some women find yoga and walking to be beneficial. °· Eat a balanced and nourishing diet. °· Do little things that you enjoy. Have a cup of tea, take a bubble bath, read your favorite magazine, or listen to your favorite music. °· Avoid alcohol. °· Ask for help with household chores, cooking, grocery shopping, or running errands as needed. Do not try to do everything. °· Talk to people close to you about how you are feeling.  Get support from your partner, family members, friends, or other new moms. °· Try to stay positive in how you think. Think about the things you are grateful for. °· Do not spend a lot of time alone. °· Only take over-the-counter or prescription medicine as directed by your health care provider. °· Keep all your postpartum appointments. °· Let your health care provider know if you have any concerns. °Contact a health care provider if: °You are having a reaction to or problems with your medicine. °Get help right away if: °· You have suicidal feelings. °· You think you may harm the baby or someone else. °This information is not intended to replace advice given to you by your health care provider. Make sure you discuss any questions you have   with your health care provider. Document Released: 10/28/2003 Document Revised: 07/01/2015 Document Reviewed: 11/04/2012 Elsevier Interactive Patient Education  2017 [employer]   Preeclampsia and Eclampsia Preeclampsia is a serious condition that develops only during pregnancy. It is also called toxemia of pregnancy. This condition causes high blood pressure along with other symptoms, such as swelling and headaches. These symptoms may develop as the condition gets worse. Preeclampsia may occur at 20 weeks of pregnancy or later. Diagnosing and treating preeclampsia early is very important. If not treated early, it can cause serious problems for you and your baby. One problem it can lead to is eclampsia, which is a condition that causes muscle jerking or shaking (convulsions or seizures) in the mother. Delivering your baby is the best treatment for preeclampsia or eclampsia. Preeclampsia and eclampsia symptoms usually go away after your baby is born. What are the causes? The cause of preeclampsia is not known. What increases the risk? The following risk factors make you more likely to develop preeclampsia:  Being pregnant for the first time.  Having had preeclampsia  during a past pregnancy.  Having a family history of preeclampsia.  Having high blood pressure.  Being pregnant with twins or triplets.  Being 2735 or older.  Being African-American.  Having kidney disease or diabetes.  Having medical conditions such as lupus or blood diseases.  Being very overweight (obese).  What are the signs or symptoms? The earliest signs of preeclampsia are:  High blood pressure.  Increased protein in your urine. Your health care provider will check for this at every visit before you give birth (prenatal visit).  Other symptoms that may develop as the condition gets worse include:  Severe headaches.  Sudden weight gain.  Swelling of the hands, face, legs, and feet.  Nausea and vomiting.  Vision problems, such as blurred or double vision.  Numbness in the face, arms, legs, and feet.  Urinating less than usual.  Dizziness.  Slurred speech.  Abdominal pain, especially upper abdominal pain.  Convulsions or seizures.  Symptoms generally go away after giving birth. How is this diagnosed? There are no screening tests for preeclampsia. Your health care provider will ask you about symptoms and check for signs of preeclampsia during your prenatal visits. You may also have tests that include:  Urine tests.  Blood tests.  Checking your blood pressure.  Monitoring your babys heart rate.  Ultrasound.  How is this treated? You and your health care provider will determine the treatment approach that is best for you. Treatment may include:  Having more frequent prenatal exams to check for signs of preeclampsia, if you have an increased risk for preeclampsia.  Bed rest.  Reducing how much salt (sodium) you eat.  Medicine to lower your blood pressure.  Staying in the hospital, if your condition is severe. There, treatment will focus on controlling your blood pressure and the amount of fluids in your body (fluid retention).  You may need  to take medicine (magnesium sulfate) to prevent seizures. This medicine may be given as an injection or through an IV tube.  Delivering your baby early, if your condition gets worse. You may have your labor started with medicine (induced), or you may have a cesarean delivery.  Follow these instructions at home: Eating and drinking   Drink enough fluid to keep your urine clear or pale yellow.  Eat a healthy diet that is low in sodium. Do not add salt to your food. Check nutrition labels to see how  much sodium a food or beverage contains.  Avoid caffeine. Lifestyle  Do not use any products that contain nicotine or tobacco, such as cigarettes and e-cigarettes. If you need help quitting, ask your health care provider.  Do not use alcohol or drugs.  Avoid stress as much as possible. Rest and get plenty of sleep. General instructions  Take over-the-counter and prescription medicines only as told by your health care provider.  When lying down, lie on your side. This keeps pressure off of your baby.  When sitting or lying down, raise (elevate) your feet. Try putting some pillows underneath your lower legs.  Exercise regularly. Ask your health care provider what kinds of exercise are best for you.  Keep all follow-up and prenatal visits as told by your health care provider. This is important. How is this prevented? To prevent preeclampsia or eclampsia from developing during another pregnancy:  Get proper medical care during pregnancy. Your health care provider may be able to prevent preeclampsia or diagnose and treat it early.  Your health care provider may have you take a low-dose aspirin or a calcium supplement during your next pregnancy.  You may have tests of your blood pressure and kidney function after giving birth.  Maintain a healthy weight. Ask your health care provider for help managing weight gain during pregnancy.  Work with your health care provider to manage any  long-term (chronic) health conditions you have, such as diabetes or kidney problems.  Contact a health care provider if:  You gain more weight than expected.  You have headaches.  You have nausea or vomiting.  You have abdominal pain.  You feel dizzy or light-headed. Get help right away if:  You develop sudden or severe swelling anywhere in your body. This usually happens in the legs.  You gain 5 lbs (2.3 kg) or more during one week.  You have severe: ? Abdominal pain. ? Headaches. ? Dizziness. ? Vision problems. ? Confusion. ? Nausea or vomiting.  You have a seizure.  You have trouble moving any part of your body.  You develop numbness in any part of your body.  You have trouble speaking.  You have any abnormal bleeding.  You pass out. This information is not intended to replace advice given to you by your health care provider. Make sure you discuss any questions you have with your health care provider. Document Released: 01/21/2000 Document Revised: 09/21/2015 Document Reviewed: 08/30/2015 Elsevier Interactive Patient Education  [employer]2018 [employer]

## 2017-09-27 ENCOUNTER — Ambulatory Visit: Payer: Medicaid Other | Admitting: Hematology

## 2017-10-26 ENCOUNTER — Inpatient Hospital Stay (HOSPITAL_COMMUNITY): Admit: 2017-10-26 | Payer: Medicaid Other | Admitting: Obstetrics & Gynecology

## 2018-08-25 ENCOUNTER — Emergency Department (HOSPITAL_COMMUNITY)
Admission: EM | Admit: 2018-08-25 | Discharge: 2018-08-26 | Discharge status: Against Medical Advice | Payer: Medicaid Other | Attending: Emergency Medicine | Admitting: Emergency Medicine

## 2018-08-25 DIAGNOSIS — L0291 Cutaneous abscess, unspecified: Secondary | ICD-10-CM

## 2018-08-25 DIAGNOSIS — L02211 Cutaneous abscess of abdominal wall: Secondary | ICD-10-CM | POA: Insufficient documentation

## 2018-08-25 DIAGNOSIS — R7401 Elevation of levels of liver transaminase levels: Secondary | ICD-10-CM

## 2018-08-25 DIAGNOSIS — R740 Nonspecific elevation of levels of transaminase and lactic acid dehydrogenase [LDH]: Secondary | ICD-10-CM | POA: Insufficient documentation

## 2018-08-25 DIAGNOSIS — I10 Essential (primary) hypertension: Secondary | ICD-10-CM | POA: Insufficient documentation

## 2018-08-25 DIAGNOSIS — D649 Anemia, unspecified: Secondary | ICD-10-CM | POA: Insufficient documentation

## 2018-08-26 ENCOUNTER — Other Ambulatory Visit: Payer: Self-pay

## 2018-08-26 ENCOUNTER — Encounter (HOSPITAL_COMMUNITY): Payer: Self-pay | Admitting: Emergency Medicine

## 2018-08-26 LAB — CBC WITH DIFFERENTIAL/PLATELET
Abs Immature Granulocytes: 0.01 10*3/uL (ref 0.00–0.07)
Basophils Absolute: 0.1 10*3/uL (ref 0.0–0.1)
Basophils Relative: 1 %
Eosinophils Absolute: 2 10*3/uL — ABNORMAL HIGH (ref 0.0–0.5)
Eosinophils Relative: 29 %
HCT: 26.1 % — ABNORMAL LOW (ref 36.0–46.0)
Hemoglobin: 7.4 g/dL — ABNORMAL LOW (ref 12.0–15.0)
Immature Granulocytes: 0 %
Lymphocytes Relative: 37 %
Lymphs Abs: 2.6 10*3/uL (ref 0.7–4.0)
MCH: 19.4 pg — ABNORMAL LOW (ref 26.0–34.0)
MCHC: 28.4 g/dL — ABNORMAL LOW (ref 30.0–36.0)
MCV: 68.5 fL — ABNORMAL LOW (ref 80.0–100.0)
Monocytes Absolute: 0.4 10*3/uL (ref 0.1–1.0)
Monocytes Relative: 5 %
Neutro Abs: 1.9 10*3/uL (ref 1.7–7.7)
Neutrophils Relative %: 28 %
Platelets: 393 10*3/uL (ref 150–400)
RBC: 3.81 MIL/uL — ABNORMAL LOW (ref 3.87–5.11)
RDW: 17.1 % — ABNORMAL HIGH (ref 11.5–15.5)
WBC: 7 10*3/uL (ref 4.0–10.5)
nRBC: 0 % (ref 0.0–0.2)

## 2018-08-26 LAB — COMPREHENSIVE METABOLIC PANEL
ALT: 57 U/L — ABNORMAL HIGH (ref 0–44)
AST: 26 U/L (ref 15–41)
Albumin: 3 g/dL — ABNORMAL LOW (ref 3.5–5.0)
Alkaline Phosphatase: 111 U/L (ref 38–126)
Anion gap: 6 (ref 5–15)
BUN: 10 mg/dL (ref 6–20)
CO2: 25 mmol/L (ref 22–32)
Calcium: 8.8 mg/dL — ABNORMAL LOW (ref 8.9–10.3)
Chloride: 107 mmol/L (ref 98–111)
Creatinine, Ser: 0.75 mg/dL (ref 0.44–1.00)
GFR calc Af Amer: >60 mL/min (ref >60)
GFR calc non Af Amer: >60 mL/min (ref >60)
Glucose, Bld: 108 mg/dL — ABNORMAL HIGH (ref 70–99)
Potassium: 3.7 mmol/L (ref 3.5–5.1)
Sodium: 138 mmol/L (ref 135–145)
Total Bilirubin: 0.4 mg/dL (ref 0.3–1.2)
Total Protein: 6.6 g/dL (ref 6.5–8.1)

## 2018-08-26 LAB — LACTIC ACID, PLASMA: Lactic Acid, Venous: 0.8 mmol/L (ref 0.5–1.9)

## 2018-08-26 LAB — I-STAT BETA HCG BLOOD, ED (MC, WL, AP ONLY): I-stat hCG, quantitative: <5 m[IU]/mL (ref <5)

## 2018-08-26 MED ORDER — CEPHALEXIN 500 MG PO CAPS: 500.0000 mg | ORAL_CAPSULE | Freq: Four times a day (QID) | ORAL | 0 refills | Status: AC

## 2018-08-26 MED ORDER — LIDOCAINE HCL 2 % IJ SOLN
10.0000 mL | Freq: Once | INTRAMUSCULAR | Status: AC
  Administered 2018-08-26: 07:00:00 200 mg via INTRADERMAL
  Filled 2018-08-26: qty 20

## 2018-08-26 MED ORDER — SODIUM CHLORIDE 0.9 % IV SOLN: 2.0000 g | Freq: Once | INTRAVENOUS | Status: DC

## 2018-08-26 MED ORDER — VANCOMYCIN HCL IN DEXTROSE 1-5 GM/200ML-% IV SOLN: 1000.0000 mg | Freq: Once | INTRAVENOUS | Status: DC

## 2018-08-26 MED ORDER — SULFAMETHOXAZOLE-TRIMETHOPRIM 800-160 MG PO TABS: 1.0000 | ORAL_TABLET | Freq: Two times a day (BID) | ORAL | 0 refills | Status: AC

## 2018-08-26 MED ORDER — LIDOCAINE-EPINEPHRINE-TETRACAINE (LET) SOLUTION: 3.0000 mL | Freq: Once | NASAL | Status: DC

## 2018-08-26 NOTE — Discharge Instructions (Addendum)
You were given a prescription for antibiotics. Please take the antibiotic prescription fully.   Please follow up with your primary care provider within 5-7 days for re-evaluation of your symptoms. If you do not have a primary care provider, information for a healthcare clinic has been provided for you to make arrangements for follow up care.  Please return to the emergency room immediately if you experience any new or worsening symptoms or any symptoms that indicate worsening infection such as fevers, increased redness/swelling/pain, warmth, or drainage from the affected area.    

## 2018-08-26 NOTE — ED Provider Notes (Signed)
[facility]   CSN: [id number] Arrival date & time: 08/25/18  2344    History   Chief Complaint Chief Complaint  Patient presents with  . Insect Bite    HPI Tina Lambert is a 33 y.o. female.     HPI   Patient is a [age] year old female with a history of anemia, HSV, gestational diabetes, hypertension, who presents to the emergency department for evaluation of abscess to the right side of the abdomen.  States that she thinks she had a spider bite and that she initially had a small red area to the right side of the abdomen.  The redness seemed to get worse and then she started to have pus draining out of it.  She tried to pop this by herself and the redness continued to worsen.  She has had no fevers or chills.  No abdominal pain nausea vomiting diarrhea or urinary symptoms.  Denies any history of diabetes, HIV or other immunocompromise.  Past Medical History:  Diagnosis Date  . Abnormal Pap smear   . Anemia   . Eczema   . Enlarged thyroid   . Genital herpes   . HSV-2 infection   . Hx gestational diabetes    with 2nd pregnancy  . Hypertension   . Infection    UTI  . Obesity   . Preeclampsia   . Preterm labor   . Prior pregnancy with fetal demise    21 weeks; 1st pregnancy  . SVD (spontaneous vaginal delivery) 2007   x 6 - 1 fetal demise at 21 weeks    Patient Active Problem List   Diagnosis Date Noted  . Preeclampsia, severe, third trimester 09/23/2017  . Twin pregnancy in third trimester 09/18/2017  . Genital herpes simplex type 2 09/18/2017  . Anemia 09/18/2017  . Incompetent cervix in pregnancy 09/18/2017  . Cholelithiasis affecting pregnancy in third trimester, antepartum 09/18/2017  . Preterm uterine contractions 09/15/2017  . Preterm labor in third trimester 09/03/2017  . NSVD (normal spontaneous vaginal delivery) 10/03/2013  . Preeclampsia 10/02/2013  . Variable fetal heart rate decelerations,  antepartum 09/25/2013    Past Surgical History:  Procedure Laterality Date  . CERVICAL CERCLAGE N/A 05/11/2017   Procedure: CERCLAGE CERVICAL;  Surgeon: Osborn Cohooberts, Angela, MD;  Location: [facility];  Service: Gynecology;  Laterality: N/A;  . CERVICAL CERCLAGE N/A 09/23/2017   Procedure: CERCLAGE CERVICAL REMOVAL;  Surgeon: Essie HartPinn, Walda, MD;  [facility];  Service: Obstetrics;  Laterality: N/A;  . CESAREAN SECTION MULTI-GESTATIONAL WITH TUBAL N/A 09/23/2017   Procedure: CESAREAN SECTION MULTI-GESTATIONAL WITH TUBAL;  Surgeon: Essie HartPinn, Walda, MD;  Location: [facility];  Service: Obstetrics;  Laterality: N/A;  . TONSILLECTOMY    . WISDOM TOOTH EXTRACTION       OB History    Gravida  6   Para  6   Term  4   Preterm  2   AB  0   Living  6     SAB  0   TAB      Ectopic      Multiple  1   Live Births  7            Home Medications    Prior to Admission medications   Medication Sig Start Date End Date Taking? Authorizing Provider  ibuprofen (ADVIL,MOTRIN) 600 MG tablet Take 1 tablet (600 mg total) by mouth every 6 (six) hours as needed for mild pain or moderate pain.  09/26/17  Yes Janeece RiggersGreer, Ellis K, CNM  NIFEdipine (PROCARDIA-XL/ADALAT CC) 30 MG 24 hr tablet Take 1 tablet (30 mg total) by mouth daily. 09/26/17  Yes Janeece RiggersGreer, Ellis K, CNM  valACYclovir (VALTREX) 1000 MG tablet Take 500 mg by mouth 2 (two) times daily.   Yes [provider]  cephALEXin (KEFLEX) 500 MG capsule Take 1 capsule (500 mg total) by mouth 4 (four) times daily for 7 days. 08/26/18 09/02/18  Terisa Belardo S, PA-C  famotidine (PEPCID) 20 MG tablet Take 1 tablet (20 mg total) by mouth 2 (two) times daily. Patient not taking: Reported on 08/26/2018 09/07/17 09/07/18  Clemmons, Elmore GuiseLori A, CNM  ferrous sulfate 325 (65 FE) MG tablet Take 1 tablet (325 mg total) by mouth daily with breakfast. Patient not taking: Reported on 08/26/2018 09/26/17   Janeece RiggersGreer, Ellis K, CNM  sulfamethoxazole-trimethoprim  (BACTRIM DS) 800-160 MG tablet Take 1 tablet by mouth 2 (two) times daily for 7 days. 08/26/18 09/02/18  Siarah Deleo S, PA-C    Family History Family History  Problem Relation Age of Onset  . Cancer Paternal Uncle        1 prostate, 1 colon  . Hypertension Mother   . Hypertension Father   . Hypertension Maternal Grandmother   . Other Neg Hx     Social History Social History   Tobacco Use  . Smoking status: Never Smoker  . Smokeless tobacco: Never Used  Substance Use Topics  . Alcohol use: No  . Drug use: No     Allergies   Patient has no known allergies.   Review of Systems Review of Systems  Constitutional: Negative for chills and fever.  HENT: Negative for ear pain and sore throat.   Eyes: Negative for visual disturbance.  Respiratory: Negative for cough and shortness of breath.   Cardiovascular: Negative for chest pain.  Gastrointestinal: Negative for abdominal pain, constipation, diarrhea, nausea and vomiting.  Genitourinary: Negative for dysuria.  Musculoskeletal: Negative for back pain.  Skin: Positive for wound.  Neurological: Negative for headaches.  All other systems reviewed and are negative.    Physical Exam Updated Vital Signs BP 128/90   Pulse 69   Temp 98 F (36.7 C) (Oral)   Resp 18   LMP 08/18/2018   SpO2 100%   Physical Exam Vitals signs and nursing note reviewed.  Constitutional:      General: She is not in acute distress.    Appearance: She is well-developed.  HENT:     Head: Normocephalic and atraumatic.  Eyes:     Conjunctiva/sclera: Conjunctivae normal.  Neck:     Musculoskeletal: Neck supple.  Cardiovascular:     Rate and Rhythm: Normal rate and regular rhythm.     Pulses: Normal pulses.     Heart sounds: Normal heart sounds. No murmur.  Pulmonary:     Effort: Pulmonary effort is normal. No respiratory distress.     Breath sounds: Normal breath sounds. No wheezing, rhonchi or rales.  Abdominal:     General: Bowel  sounds are normal. There is no distension.     Palpations: Abdomen is soft.     Tenderness: There is no abdominal tenderness. There is no guarding or rebound.  Musculoskeletal:     Comments: 5x4626m cm area of induration with central 3x3cm open wound to the right side of the abdominal wall that is actively draining purulent material. Overlying erythema present.  Skin:    General: Skin is warm and dry.  Neurological:  Mental Status: She is alert.         ED Treatments / Results  Labs (all labs ordered are listed, but only abnormal results are displayed) Labs Reviewed  COMPREHENSIVE METABOLIC PANEL - Abnormal; Notable for the following components:      Result Value   Glucose, Bld 108 (*)    Calcium 8.8 (*)    Albumin 3.0 (*)    ALT 57 (*)    All other components within normal limits  CBC WITH DIFFERENTIAL/PLATELET - Abnormal; Notable for the following components:   RBC 3.81 (*)    Hemoglobin 7.4 (*)    HCT 26.1 (*)    MCV 68.5 (*)    MCH 19.4 (*)    MCHC 28.4 (*)    RDW 17.1 (*)    Eosinophils Absolute 2.0 (*)    All other components within normal limits  AEROBIC CULTURE (SUPERFICIAL SPECIMEN)  LACTIC ACID, PLASMA  I-STAT BETA HCG BLOOD, ED ([facility], [facility], [facility] ONLY)    EKG None  Radiology No results found.  Procedures Procedures (including critical care time)  Medications Ordered in ED Medications  lidocaine-EPINEPHrine-tetracaine (LET) solution (3 mLs Topical Not Given 08/26/18 0634)  vancomycin (VANCOCIN) IVPB 1000 mg/200 mL premix (has no administration in time range)  cefTRIAXone (ROCEPHIN) 2 g in sodium chloride 0.9 % 100 mL IVPB (has no administration in time range)  lidocaine (XYLOCAINE) 2 % (with pres) injection 200 mg (200 mg Intradermal Given by Other 08/26/18 [id number])     Initial Impression / Assessment and Plan / ED Course  I have reviewed the triage vital signs and the nursing notes.  Pertinent labs & imaging results that were available during my care of  the patient were reviewed by me and considered in my medical decision making (see chart for details).   Final Clinical Impressions(s) / ED Diagnoses   Final diagnoses:  Abscess  Anemia, unspecified type  Elevated ALT measurement   Pt with large abscess to the abdominal wall that has developed over the last 5 days. No fevers or systemic symptoms of infection. afebrile with normal vitals. No evidence of sepsis.   On exam abd soft and nontender. 5x3425m cm area of induration with central 3x3cm open wound to the right side of the abd wall that is actively draining purulent material. Overlying erythema present.   CBC without leukocytosis. Anemia present and appears at baseline. Pt asymptomatic and on therapy for this. CMP with hypoalbuminemia, mildly elevated ALT. Otherwise nonacute. Beta hcg negative Lactic acid negative  Pt given a dose of vanc and ceftriaxone in the ED. Will obtain ct abd/pelvis to further evaluate the extent of the wound.   On reassessment after discussing a plan to obtain CT abdomen/pelvis and give IV antibiotics the patient has now decided that she would like to leave AGAINST MEDICAL ADVICE that she has to go home and take care of her kids.  Discussed the risks of leaving AGAINST MEDICAL ADVICE which could lead to worsening infection, permanent disability or even death.  She voices understanding of the risks and still would like to be discharged.  She declines I&D procedure at this time and is requesting p.o. antibiotics.  I will give her Rx for Bactrim and Keflex.  Have given her information to follow-up with general surgery should she choose to.  Have also given her strict return precautions.  She voices understanding of the plan and reasons to return.  All questions answered.  Patient stable for discharge.  ED Discharge Orders         Ordered    cephALEXin (KEFLEX) 500 MG capsule  4 times daily     08/26/18 0727    sulfamethoxazole-trimethoprim (BACTRIM DS) 800-160  MG tablet  2 times daily     08/26/18 0727           Rodney Booze, PA-C 08/26/18 0735    Ward, Delice Bison, DO 08/26/18 2301

## 2018-08-26 NOTE — ED Notes (Signed)
Pt discharge discussed and urged to return for any new or worseneing problems. Home with perscriptions and informationprinted. Steady gait.

## 2018-08-26 NOTE — ED Triage Notes (Addendum)
C/o abscess to R side x 3 days.  Pt states she thinks it is a spider bite and has been draining.  She has been squeezing it.

## 2018-08-26 NOTE — ED Provider Notes (Signed)
Medical screening examination/treatment/procedure(s) were conducted as a shared visit with non-physician practitioner(s) and myself.  I personally evaluated the patient during the encounter.    Patient is a 33 year old female with no known past medical history who presents today with an abscess to the right abdomen for the past 3 days it has progressively gotten worse.  On exam she has approximately 10 x 6 cm indurated area with a slightly open loculated lesion to the center with purulent drainage.  Her labs are reassuring.  No leukocytosis and normal lactate.  She is anemic but this appears to be chronic.  Plan is to obtain CT of the abdomen pelvis with contrast for further evaluation of this large abscess.  She may need general surgery consultation for drainage.  She will receive broad-spectrum antibiotics in the ED.    Tina Lambert, Delice Bison, DO 08/26/18 [phone]

## 2019-03-19 ENCOUNTER — Other Ambulatory Visit (HOSPITAL_COMMUNITY): Payer: Self-pay | Admitting: Endocrinology

## 2019-03-19 DIAGNOSIS — E0590 Thyrotoxicosis, unspecified without thyrotoxic crisis or storm: Secondary | ICD-10-CM

## 2019-03-25 ENCOUNTER — Encounter (HOSPITAL_COMMUNITY): Payer: 59

## 2019-03-25 ENCOUNTER — Encounter (HOSPITAL_COMMUNITY): Payer: Self-pay

## 2019-03-26 ENCOUNTER — Encounter (HOSPITAL_COMMUNITY): Payer: 59

## 2019-11-13 IMAGING — US US MFM OB LIMITED
1 series · 15 of 24 positions shown · non-contrast
Comparison: none

[Series 1: us mfm ob limited · 24 acquisitions, 15 frames shown]
[im 1/24]
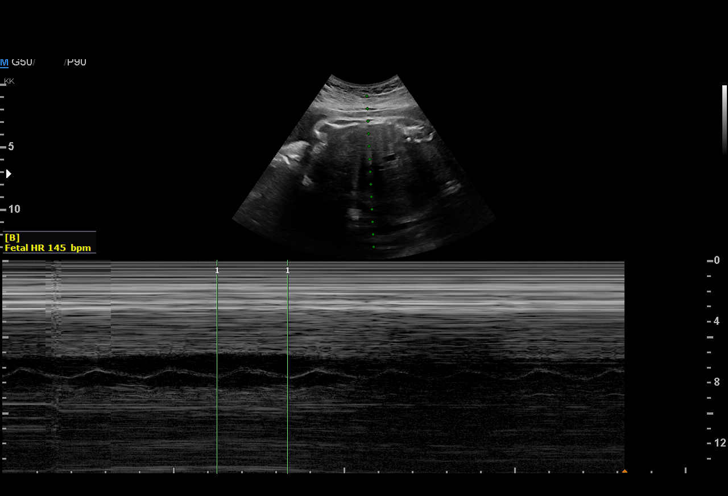
[im 3/24]
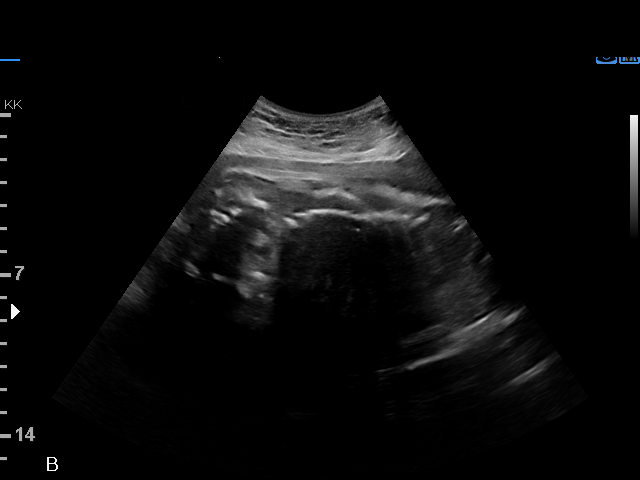
[im 5/24]
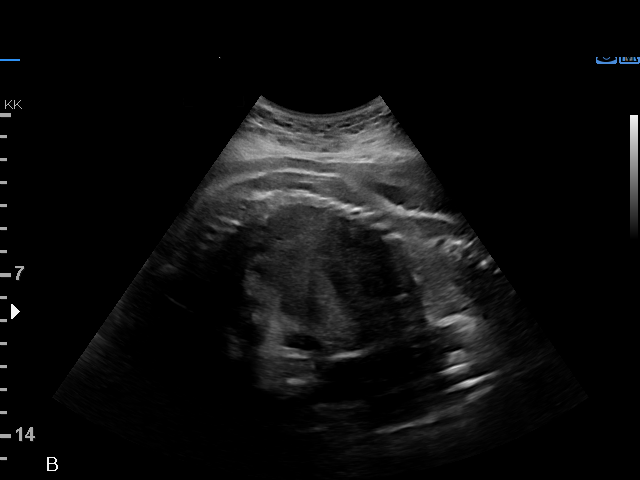
[im 6/24]
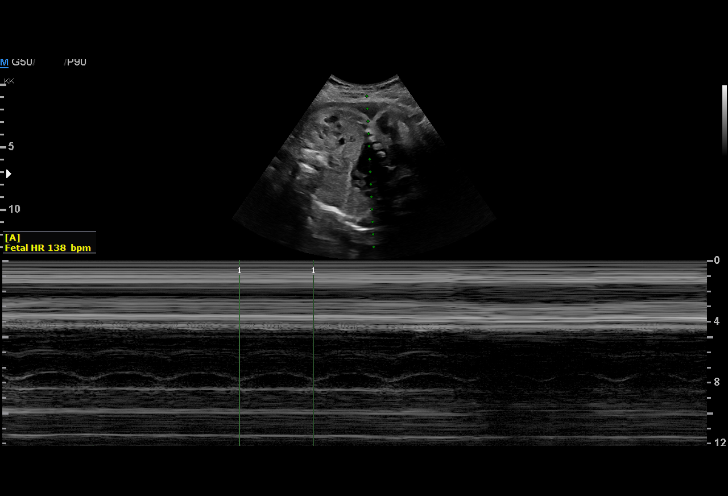
[im 8/24]
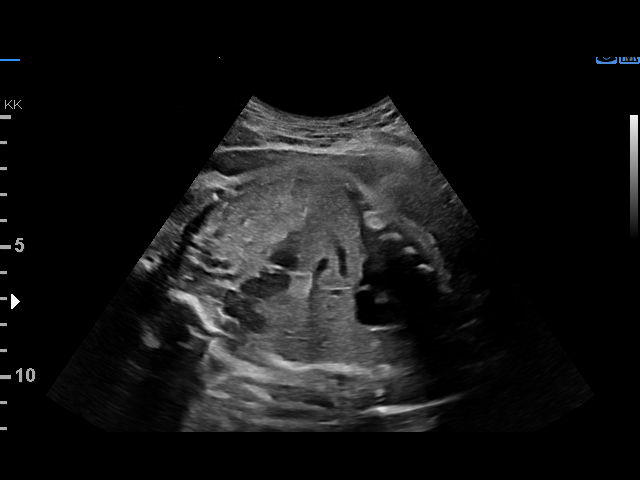
[im 9/24]
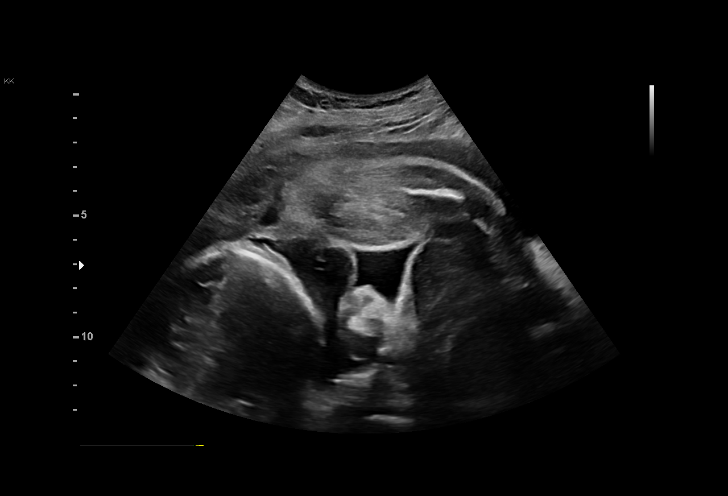
[im 11/24]
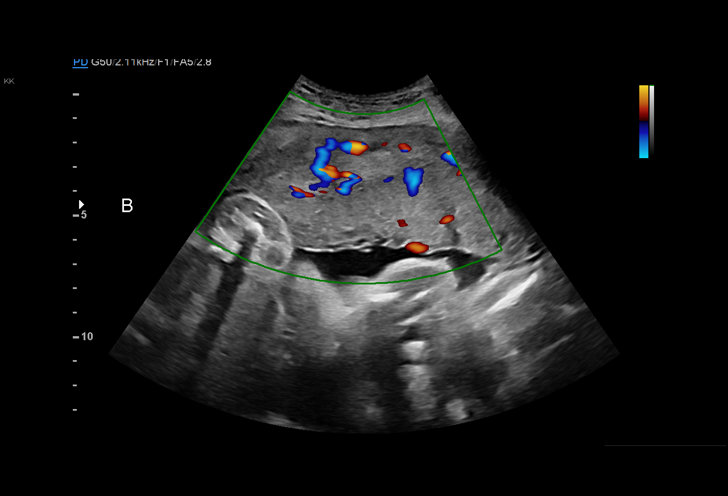
[im 13/24]
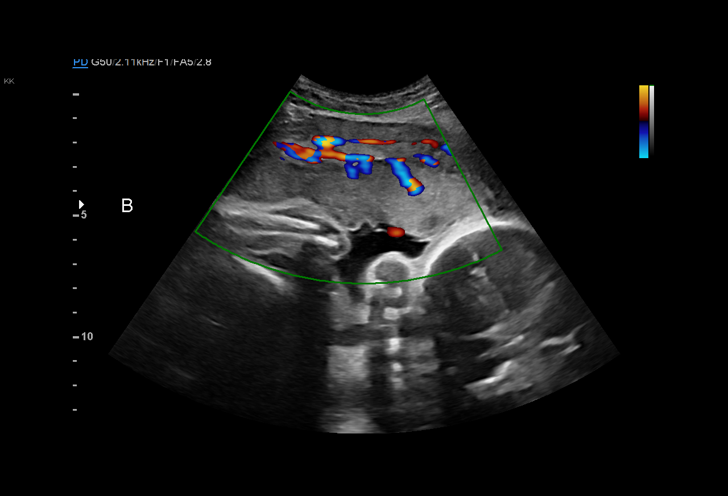
[im 14/24]
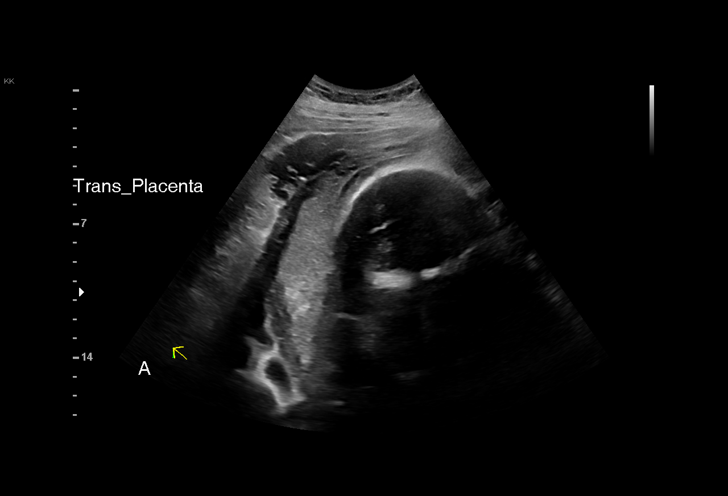
[im 16/24]
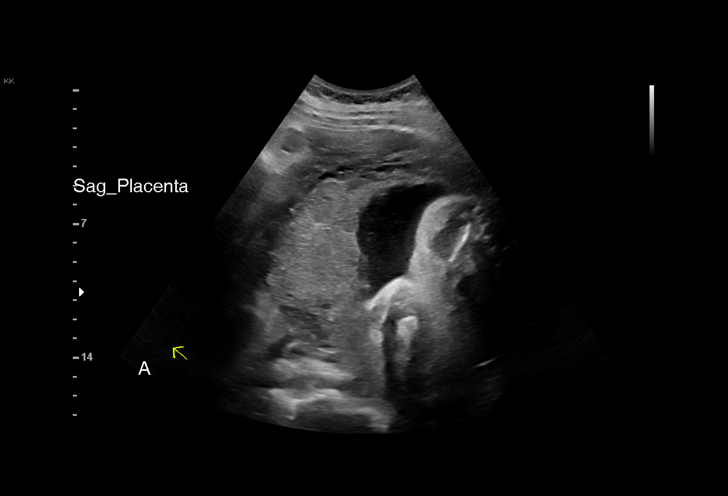
[im 17/24]
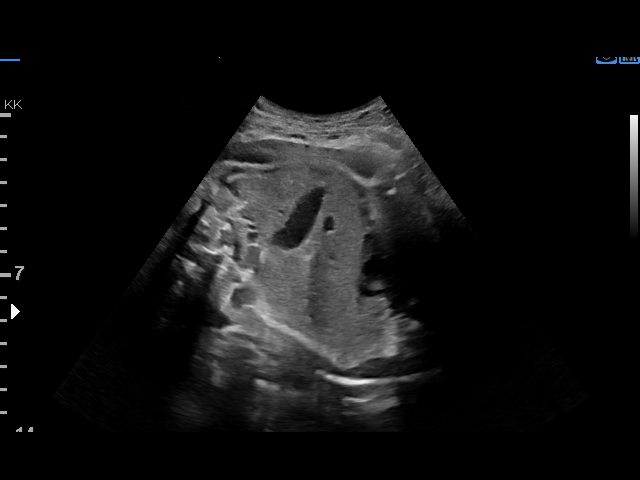
[im 19/24]
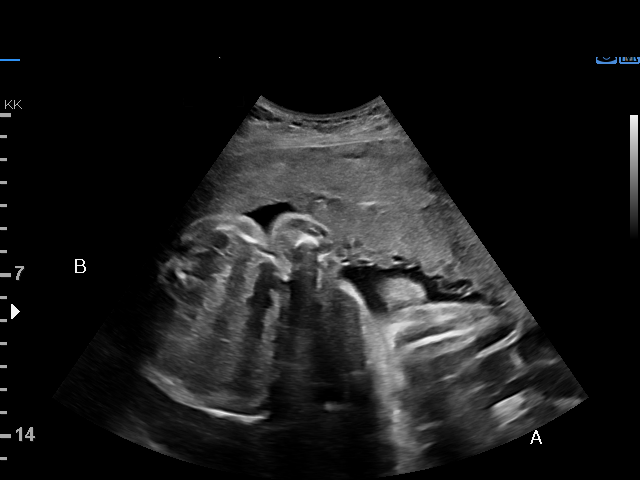
[im 21/24]
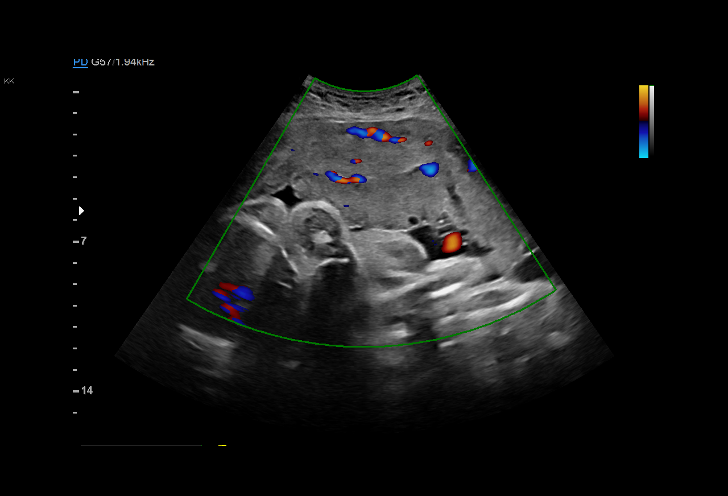
[im 22/24]
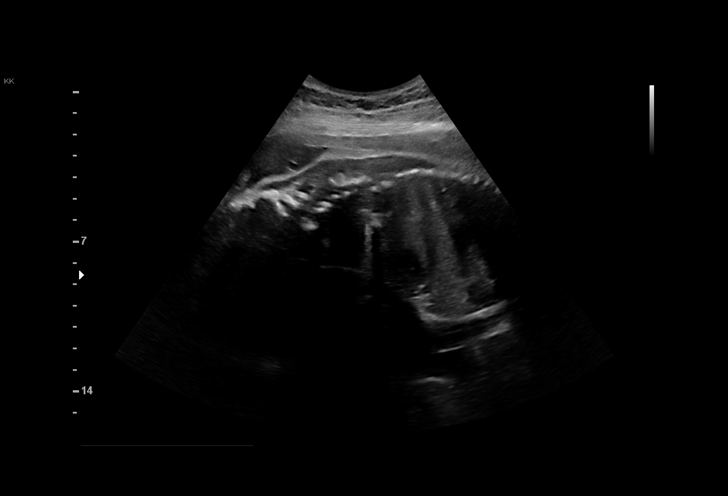
[im 24/24]
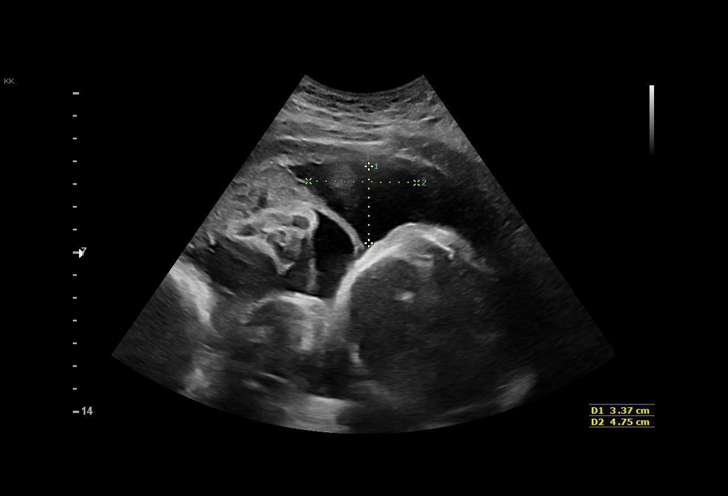

[15 of 24 positions shown; findings below may reference images not displayed]

#[id number]
Attending:        Katrice Hy        Secondary Phy.:   [facility]-
MAU/Triage

1  GORMLAITH MILLAR              [id number]      [phone]     [id number]
Indications

30 weeks gestation of pregnancy
Poor obstetric history: Previous preterm
delivery, antepartum (21 wks)
Cervical cerclage suture present, third
trimester (placed 05/11/17)
Obesity complicating pregnancy, third
trimester
Twin pregnancy, di/di, third trimester
Abdominal pain in pregnancy (Contractions)
OB History

Blood Type:            Height:  5'2"   Weight (lb):  199       BMI:
Gravidity:    6         Term:   4        Prem:   1        SAB:   0
TOP:          0       Ectopic:  0        Living: 4
Fetal Evaluation (Fetus A)

Num Of Fetuses:     2
Fetal Heart         138
Rate(bpm):
Cardiac Activity:   Observed
Fetal Lie:          Lower Fetus
Presentation:       Transverse, head to maternal left
Placenta:           Posterior
P. Cord Insertion:  Previously Visualized
Membrane Desc:      Dividing Membrane seen - Dichorionic.

Amniotic Fluid
AFI FV:      Subjectively within normal limits

Largest Pocket(cm)
3.4
Gestational Age (Fetus A)

LMP:           32w 0d        Date:  01/22/17                 EDD:   10/29/17
Best:          30w 3d     Det. By:  Early Ultrasound         EDD:   11/09/17
(04/03/17)

Fetal Evaluation (Fetus B)

Num Of Fetuses:     2
Fetal Heart         145
Rate(bpm):
Cardiac Activity:   Observed
Fetal Lie:          Upper Fetus
Presentation:       Transverse, head to maternal right
Placenta:           Anterior
P. Cord Insertion:  Previously Visualized
Membrane Desc:      Dividing Membrane seen - Dichorionic.

Amniotic Fluid
AFI FV:      Subjectively within normal limits

Largest Pocket(cm)
4.8
Gestational Age (Fetus B)

LMP:           32w 0d        Date:  01/22/17                 EDD:   10/29/17
Best:          30w 3d     Det. By:  Early Ultrasound         EDD:   11/09/17
(04/03/17)
Cervix Uterus Adnexa

Cervix
Not visualized (advanced GA >48wks)
Impression

Patient is being evaluated for preterm labor. A limited
ultrasound study was performed.

Amniotic fluid is normal in both sacs and good fetal activities
are seen.

## 2019-11-27 IMAGING — US US MFM OB LIMITED
1 series · 12 of 19 positions shown · non-contrast
Comparison: none

[Series 1: us mfm ob limited · 12 of 19 slices shown]
[im 1/19]
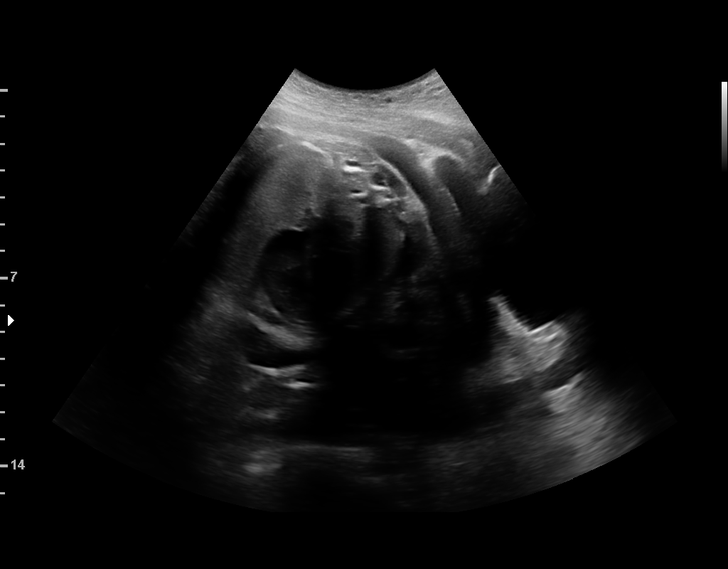
[im 3/19]
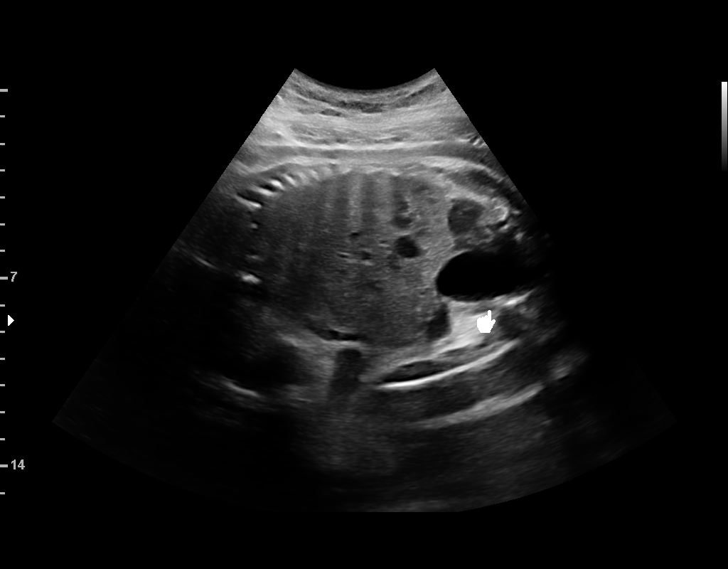
[im 4/19]
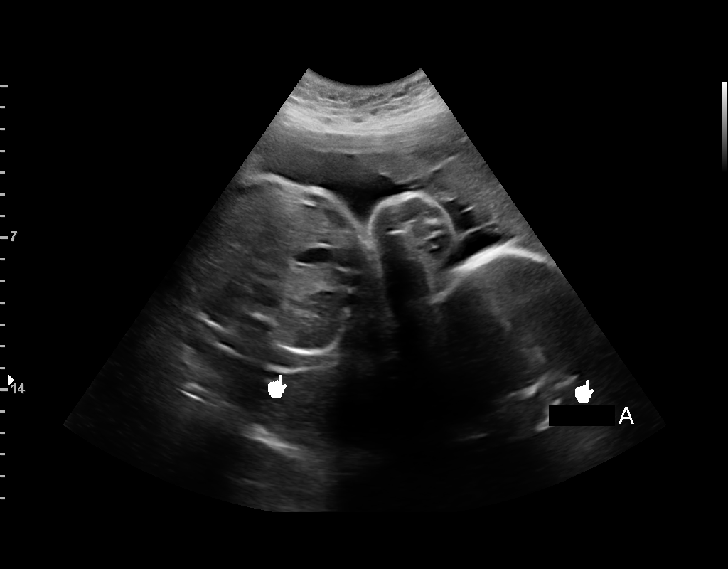
[im 6/19]
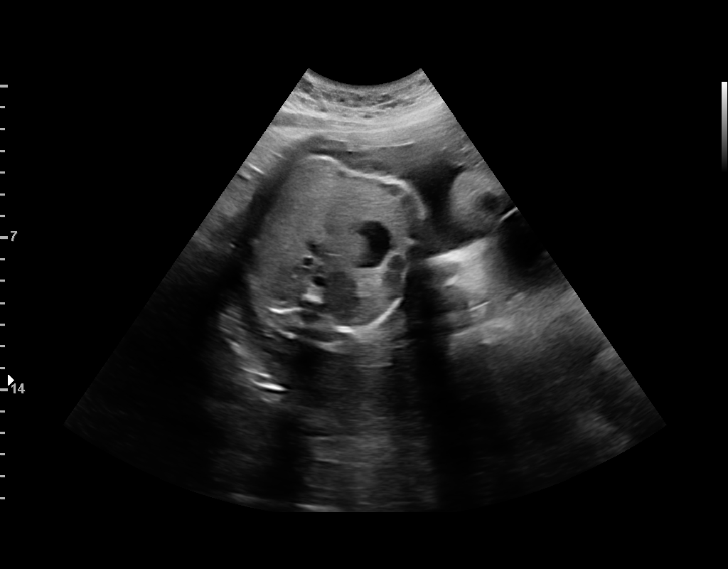
[im 8/19]
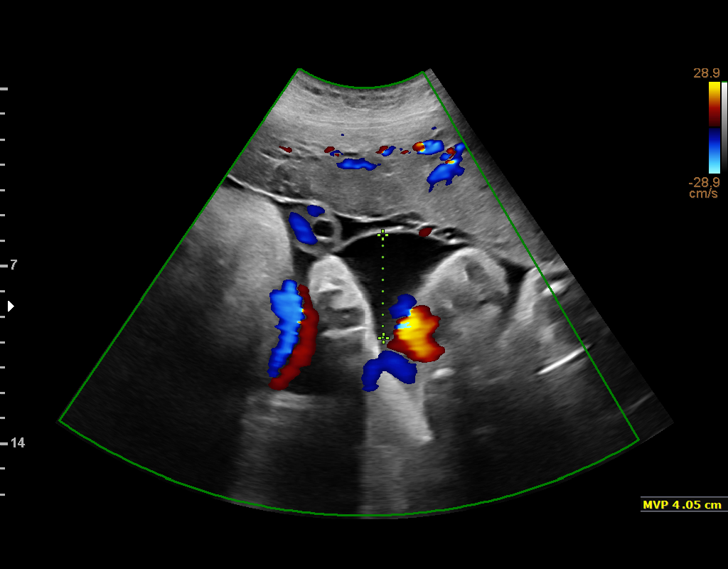
[im 9/19]
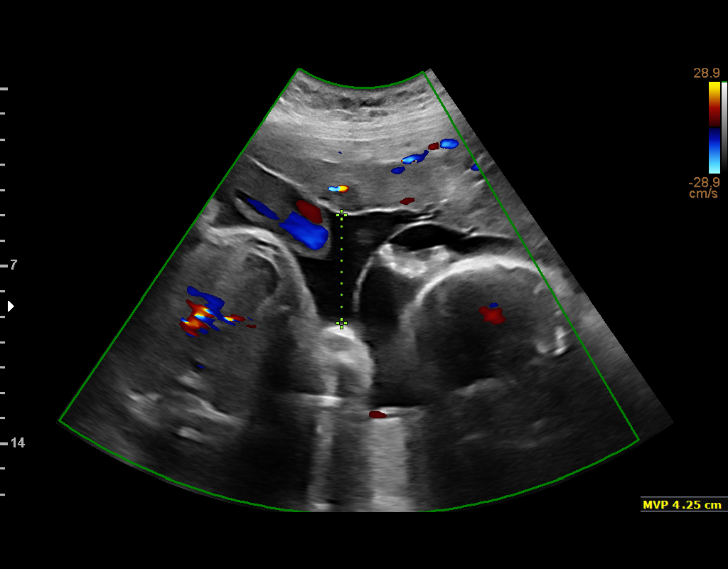
[im 11/19]
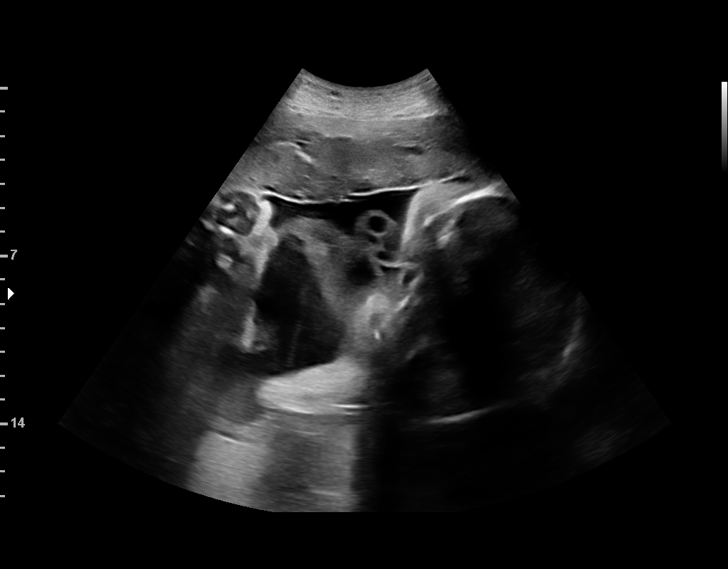
[im 12/19]
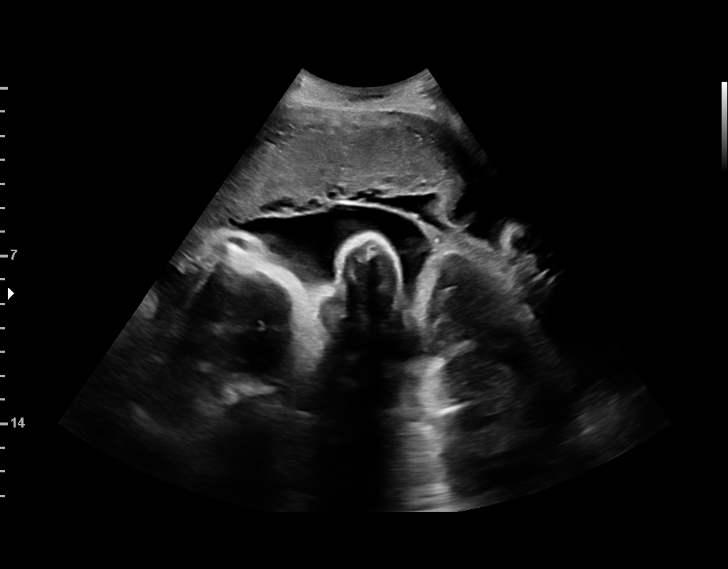
[im 14/19]
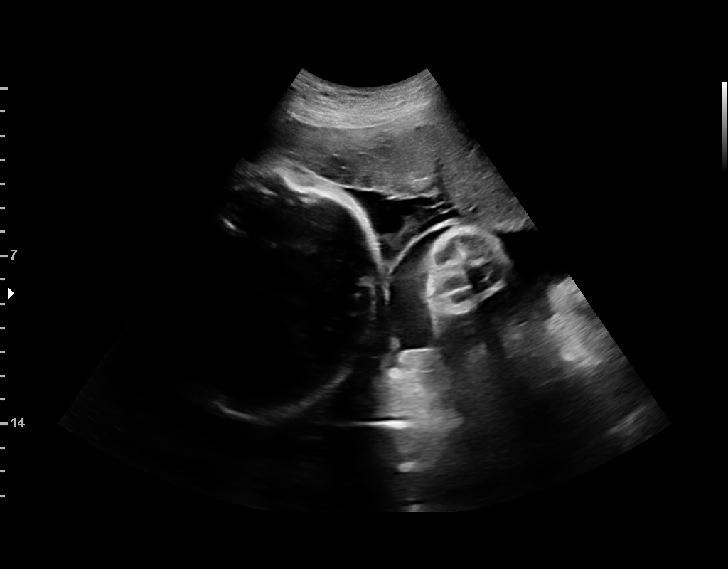
[im 16/19]
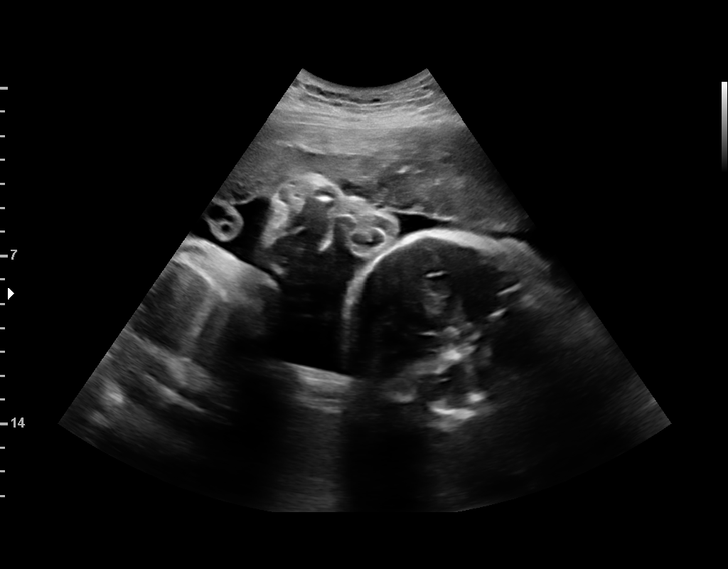
[im 17/19]
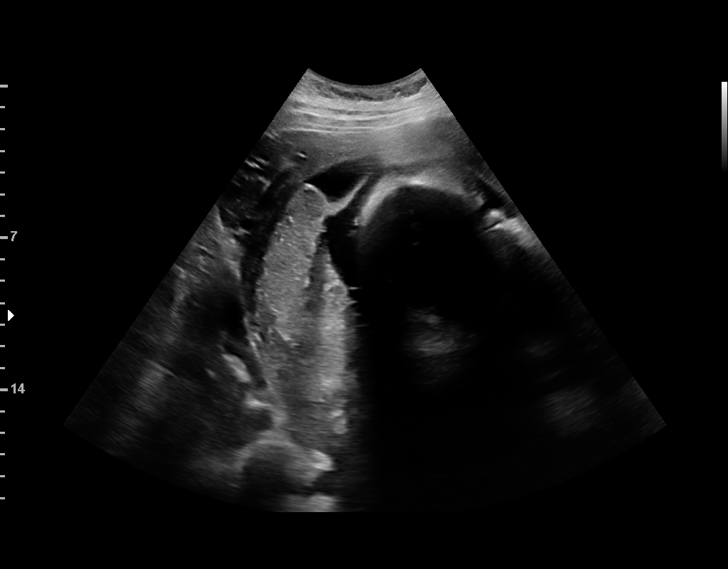
[im 19/19]
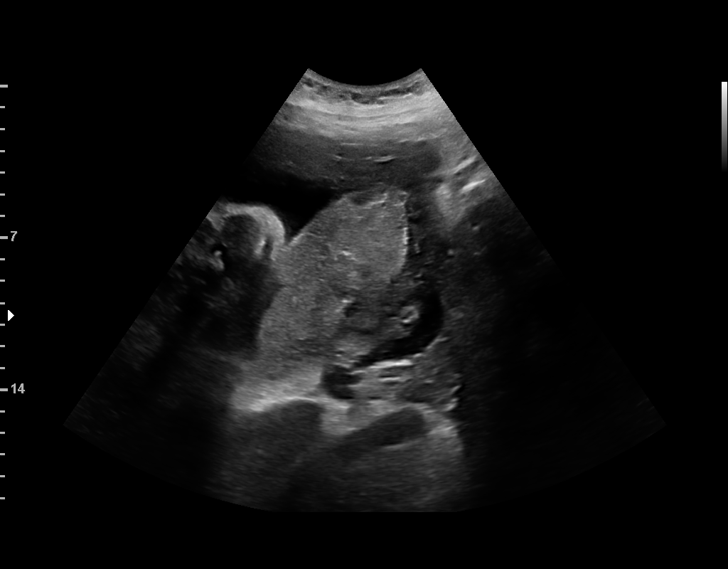

[12 of 19 positions shown; findings below may reference images not displayed]

#130
Attending:        Quinn Vilchis        Secondary Phy.:   [facility]-
MAU/Triage

1  JACK-ALLEN SOKOLOSKA            [id number]      [phone]     [id number]
Indications

32 weeks gestation of pregnancy
Poor obstetric history: Previous preterm
delivery, antepartum (21 wks)
Cervical cerclage suture present, third
trimester (placed 05/11/17)
Obesity complicating pregnancy, third
trimester
Twin pregnancy, di/di, third trimester
Preterm labor
OB History

Blood Type:            Height:  5'2"   Weight (lb):  199       BMI:
Gravidity:    6         Term:   4        Prem:   1        SAB:   0
TOP:          0       Ectopic:  0        Living: 4
Fetal Evaluation (Fetus A)

Num Of Fetuses:     2
Fetal Heart         141
Rate(bpm):
Cardiac Activity:   Observed
Fetal Lie:          Lower Fetus
Presentation:       Transverse, head to maternal right
Placenta:           Posterior
P. Cord Insertion:  Previously Visualized
Membrane Desc:      Dividing Membrane seen

Amniotic Fluid
AFI FV:      Within normal limits

Largest Pocket(cm)
4.05
Gestational Age (Fetus A)

LMP:           34w 0d        Date:  01/22/17                 EDD:   10/29/17
Best:          32w 3d     Det. By:  Early Ultrasound         EDD:   11/09/17
(04/03/17)

Fetal Evaluation (Fetus B)

Num Of Fetuses:     2
Fetal Heart         157
Rate(bpm):
Cardiac Activity:   Observed
Fetal Lie:          Lower Fetus
Presentation:       Transverse, head to maternal left
Placenta:           Anterior
P. Cord Insertion:  Previously Visualized
Membrane Desc:      Dividing Membrane seen

Amniotic Fluid
AFI FV:      Within normal limits

Largest Pocket(cm)
4.25
Gestational Age (Fetus B)

LMP:           34w 0d        Date:  01/22/17                 EDD:   10/29/17
Best:          32w 3d     Det. By:  Early Ultrasound         EDD:   11/09/17
(04/03/17)
Impression

A limited ultrasound study was performed.
Dichorionic-diamniotic twin pregnancy.

Twin A: Lower fetus, transverse lie and head to maternal
right, posterior placenta. Amniotic fluid is normal and good
fetal activity is seen.

Twin B: Upper fetus, transverse lie and head to maternal left,
anterior placenta. Amniotic fluid is normal and good fetal
activity is seen.
Recommendations

-See consultation note in [REDACTED].
-Follow-up scans as clinically indicated.
